# Patient Record
Sex: Female | Born: 1940 | Race: White | Hispanic: No | Smoking: Never smoker
Health system: Southern US, Community
[De-identification: ages and names within clinical notes are randomized; demographics above are authoritative.]

## PROBLEM LIST (undated history)

## (undated) DIAGNOSIS — M810 Age-related osteoporosis without current pathological fracture: Secondary | ICD-10-CM

## (undated) DIAGNOSIS — M1990 Unspecified osteoarthritis, unspecified site: Secondary | ICD-10-CM

## (undated) DIAGNOSIS — E559 Vitamin D deficiency, unspecified: Secondary | ICD-10-CM

## (undated) DIAGNOSIS — E079 Disorder of thyroid, unspecified: Secondary | ICD-10-CM

## (undated) DIAGNOSIS — E039 Hypothyroidism, unspecified: Secondary | ICD-10-CM

## (undated) DIAGNOSIS — E785 Hyperlipidemia, unspecified: Secondary | ICD-10-CM

## (undated) DIAGNOSIS — M5432 Sciatica, left side: Secondary | ICD-10-CM

## (undated) HISTORY — DX: Age-related osteoporosis without current pathological fracture: M81.0

## (undated) HISTORY — DX: Vitamin D deficiency, unspecified: E55.9

## (undated) HISTORY — DX: Disorder of thyroid, unspecified: E07.9

## (undated) HISTORY — DX: Hyperlipidemia, unspecified: E78.5

---

## 2001-07-07 ENCOUNTER — Other Ambulatory Visit: Admission: RE | Admit: 2001-07-07 | Discharge: 2001-07-07 | Payer: Self-pay | Admitting: Family Medicine

## 2004-09-03 ENCOUNTER — Ambulatory Visit: Payer: Self-pay | Admitting: Family Medicine

## 2004-11-23 HISTORY — PX: BREAST BIOPSY: SHX20

## 2005-12-02 ENCOUNTER — Ambulatory Visit: Payer: Self-pay | Admitting: Family Medicine

## 2006-09-16 ENCOUNTER — Ambulatory Visit: Payer: Self-pay | Admitting: Gastroenterology

## 2006-09-16 LAB — HM COLONOSCOPY: HM COLON: NORMAL

## 2006-12-03 ENCOUNTER — Ambulatory Visit: Payer: Self-pay | Admitting: Family Medicine

## 2007-12-08 ENCOUNTER — Ambulatory Visit: Payer: Self-pay | Admitting: Family Medicine

## 2008-12-10 ENCOUNTER — Ambulatory Visit: Payer: Self-pay | Admitting: Family Medicine

## 2009-12-12 ENCOUNTER — Ambulatory Visit: Payer: Self-pay | Admitting: Family Medicine

## 2010-12-18 ENCOUNTER — Ambulatory Visit: Payer: Self-pay | Admitting: Family Medicine

## 2011-06-04 HISTORY — PX: BASAL CELL CARCINOMA EXCISION: SHX1214

## 2011-12-23 ENCOUNTER — Ambulatory Visit: Payer: Self-pay | Admitting: Family Medicine

## 2012-12-01 ENCOUNTER — Ambulatory Visit: Payer: Self-pay | Admitting: Family Medicine

## 2012-12-01 LAB — HM DEXA SCAN

## 2012-12-23 ENCOUNTER — Ambulatory Visit: Payer: Self-pay | Admitting: Family Medicine

## 2014-11-14 LAB — LIPID PANEL
CHOLESTEROL: 194 mg/dL (ref 0–200)
HDL: 85 mg/dL — AB (ref 35–70)
LDL CALC: 79 mg/dL
TRIGLYCERIDES: 152 mg/dL (ref 40–160)

## 2014-11-14 LAB — BASIC METABOLIC PANEL
BUN: 15 mg/dL (ref 4–21)
CREATININE: 0.8 mg/dL (ref 0.5–1.1)
Glucose: 124 mg/dL
Potassium: 4.5 mmol/L (ref 3.4–5.3)
Sodium: 142 mmol/L (ref 137–147)

## 2014-11-14 LAB — HM PAP SMEAR

## 2014-11-14 LAB — CBC AND DIFFERENTIAL
HEMATOCRIT: 40 % (ref 36–46)
Hemoglobin: 13.9 g/dL (ref 12.0–16.0)
PLATELETS: 255 10*3/uL (ref 150–399)
WBC: 8 10*3/mL

## 2014-11-14 LAB — HEMOGLOBIN A1C: Hemoglobin A1C: 6.4

## 2014-11-14 LAB — TSH: TSH: 2.26 u[IU]/mL (ref 0.41–5.90)

## 2014-11-14 LAB — HEPATIC FUNCTION PANEL
ALT: 27 U/L (ref 7–35)
AST: 23 U/L (ref 13–35)

## 2014-12-25 ENCOUNTER — Ambulatory Visit: Payer: Self-pay | Admitting: Family Medicine

## 2014-12-25 DIAGNOSIS — Z1231 Encounter for screening mammogram for malignant neoplasm of breast: Secondary | ICD-10-CM | POA: Diagnosis not present

## 2014-12-25 LAB — HM MAMMOGRAPHY

## 2015-01-09 DIAGNOSIS — M9905 Segmental and somatic dysfunction of pelvic region: Secondary | ICD-10-CM | POA: Diagnosis not present

## 2015-01-09 DIAGNOSIS — M5442 Lumbago with sciatica, left side: Secondary | ICD-10-CM | POA: Diagnosis not present

## 2015-01-09 DIAGNOSIS — M955 Acquired deformity of pelvis: Secondary | ICD-10-CM | POA: Diagnosis not present

## 2015-01-09 DIAGNOSIS — M9902 Segmental and somatic dysfunction of thoracic region: Secondary | ICD-10-CM | POA: Diagnosis not present

## 2015-01-09 DIAGNOSIS — M9903 Segmental and somatic dysfunction of lumbar region: Secondary | ICD-10-CM | POA: Diagnosis not present

## 2015-02-27 DIAGNOSIS — M955 Acquired deformity of pelvis: Secondary | ICD-10-CM | POA: Diagnosis not present

## 2015-02-27 DIAGNOSIS — M5442 Lumbago with sciatica, left side: Secondary | ICD-10-CM | POA: Diagnosis not present

## 2015-02-27 DIAGNOSIS — M9905 Segmental and somatic dysfunction of pelvic region: Secondary | ICD-10-CM | POA: Diagnosis not present

## 2015-02-27 DIAGNOSIS — M9903 Segmental and somatic dysfunction of lumbar region: Secondary | ICD-10-CM | POA: Diagnosis not present

## 2015-02-27 DIAGNOSIS — M9902 Segmental and somatic dysfunction of thoracic region: Secondary | ICD-10-CM | POA: Diagnosis not present

## 2015-03-11 IMAGING — MG MM DIGITAL SCREENING BILAT W/ CAD
1 series · 5 of 5 positions shown · non-contrast
Comparison: Previous exam(s).

CLINICAL DATA: Screening.

EXAM:
DIGITAL SCREENING BILATERAL MAMMOGRAM WITH CAD

[R CC · right · 5 of 5 slices shown]
[im 1/5]
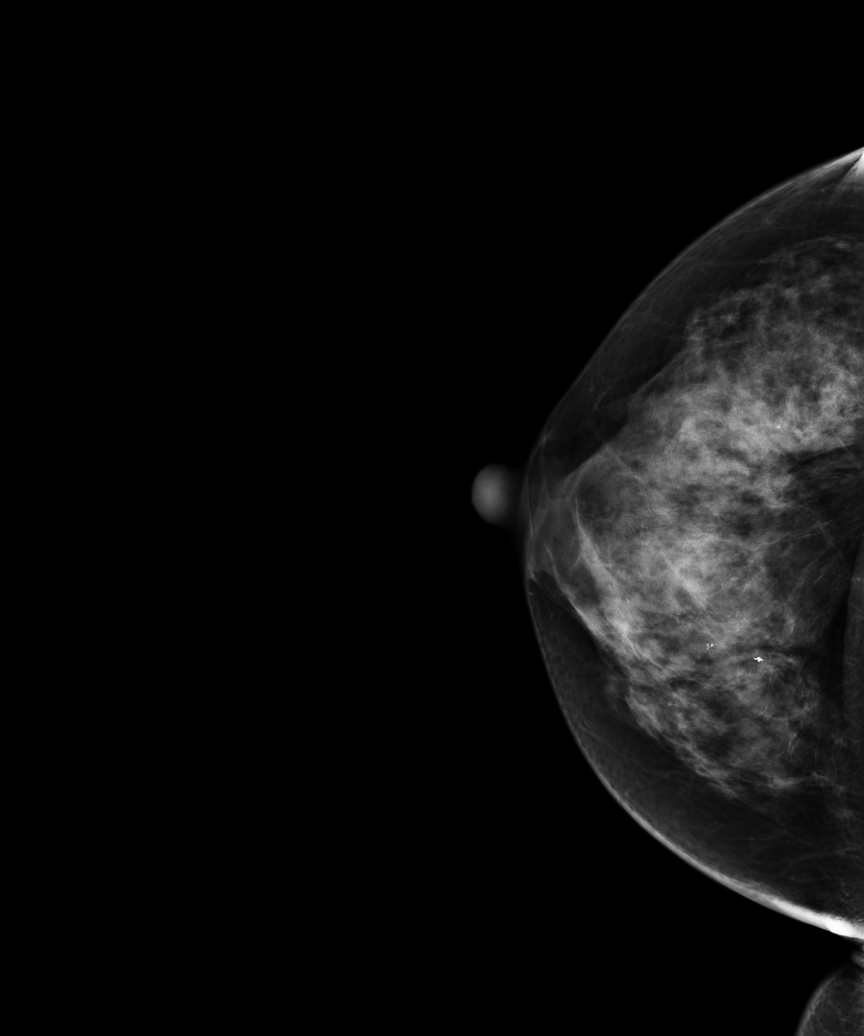
[im 2/5]
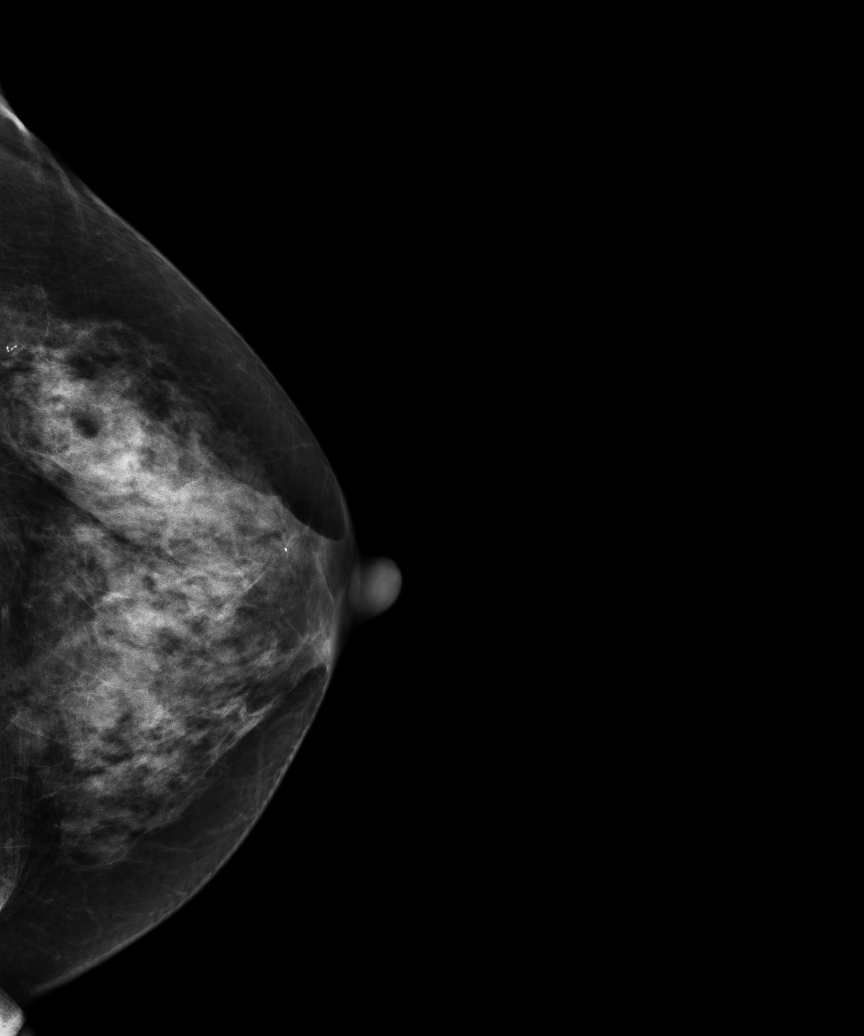
[im 3/5]
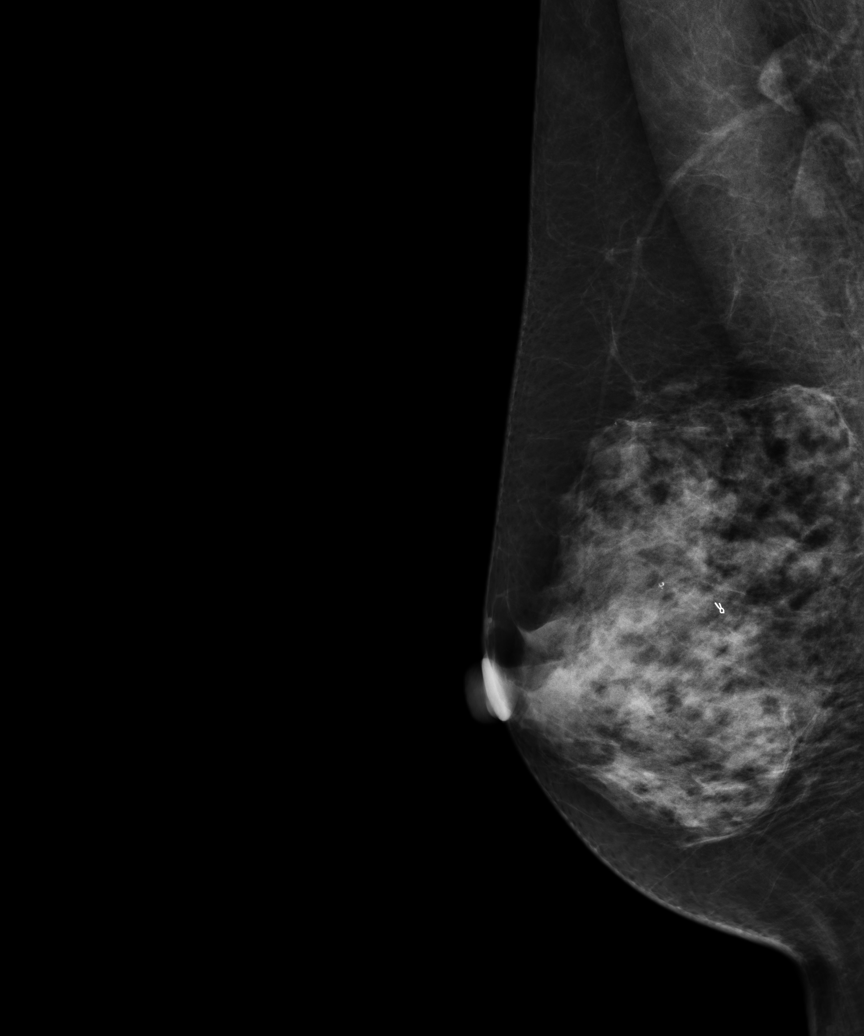
[im 4/5]
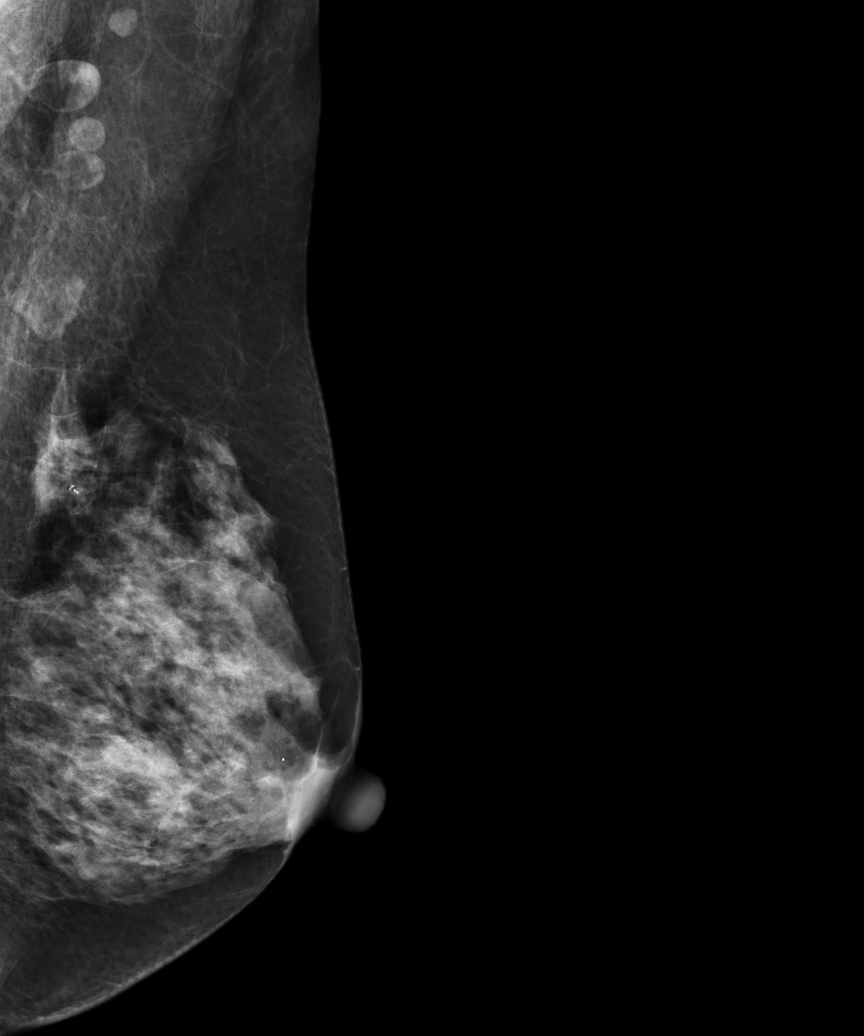
[im 5/5]
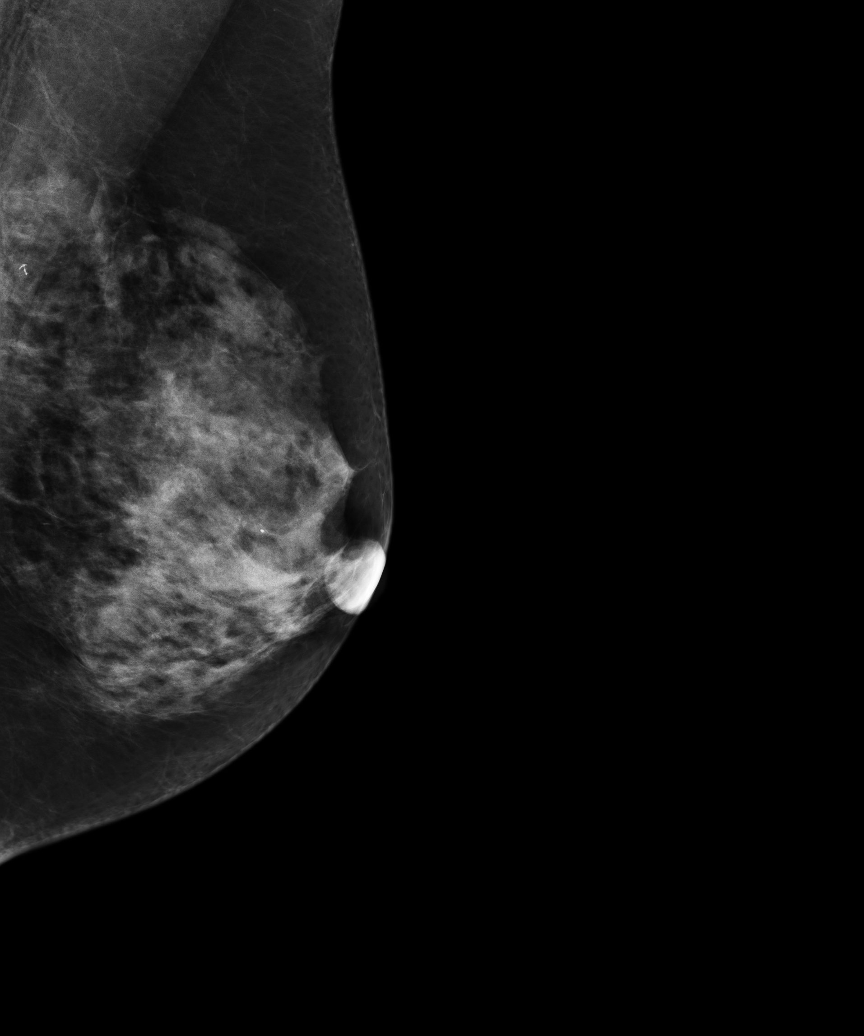

[5 of 5 positions shown; findings below may reference images not displayed]

ACR Breast Density Category d: The breast tissue is extremely dense,
which lowers the sensitivity of mammography.
FINDINGS: There are no findings suspicious for malignancy. Images were
processed with CAD.
IMPRESSION: No mammographic evidence of malignancy. A result letter of this
screening mammogram will be mailed directly to the patient.

RECOMMENDATION:
Screening mammogram in one year. (Code:[id number]-[id number])

BI-RADS CATEGORY  1: Negative.

## 2015-04-03 DIAGNOSIS — M9902 Segmental and somatic dysfunction of thoracic region: Secondary | ICD-10-CM | POA: Diagnosis not present

## 2015-04-03 DIAGNOSIS — M9905 Segmental and somatic dysfunction of pelvic region: Secondary | ICD-10-CM | POA: Diagnosis not present

## 2015-04-03 DIAGNOSIS — M955 Acquired deformity of pelvis: Secondary | ICD-10-CM | POA: Diagnosis not present

## 2015-04-03 DIAGNOSIS — M9903 Segmental and somatic dysfunction of lumbar region: Secondary | ICD-10-CM | POA: Diagnosis not present

## 2015-04-03 DIAGNOSIS — M5442 Lumbago with sciatica, left side: Secondary | ICD-10-CM | POA: Diagnosis not present

## 2015-06-23 ENCOUNTER — Other Ambulatory Visit: Payer: Self-pay | Admitting: Family Medicine

## 2015-06-23 DIAGNOSIS — E785 Hyperlipidemia, unspecified: Secondary | ICD-10-CM

## 2015-06-24 DIAGNOSIS — E785 Hyperlipidemia, unspecified: Secondary | ICD-10-CM | POA: Insufficient documentation

## 2015-06-24 DIAGNOSIS — E782 Mixed hyperlipidemia: Secondary | ICD-10-CM | POA: Insufficient documentation

## 2015-09-04 DIAGNOSIS — M5442 Lumbago with sciatica, left side: Secondary | ICD-10-CM | POA: Diagnosis not present

## 2015-09-04 DIAGNOSIS — M9905 Segmental and somatic dysfunction of pelvic region: Secondary | ICD-10-CM | POA: Diagnosis not present

## 2015-09-04 DIAGNOSIS — M955 Acquired deformity of pelvis: Secondary | ICD-10-CM | POA: Diagnosis not present

## 2015-09-04 DIAGNOSIS — M9902 Segmental and somatic dysfunction of thoracic region: Secondary | ICD-10-CM | POA: Diagnosis not present

## 2015-09-04 DIAGNOSIS — M9903 Segmental and somatic dysfunction of lumbar region: Secondary | ICD-10-CM | POA: Diagnosis not present

## 2015-10-09 DIAGNOSIS — M9903 Segmental and somatic dysfunction of lumbar region: Secondary | ICD-10-CM | POA: Diagnosis not present

## 2015-10-09 DIAGNOSIS — M5442 Lumbago with sciatica, left side: Secondary | ICD-10-CM | POA: Diagnosis not present

## 2015-10-09 DIAGNOSIS — M955 Acquired deformity of pelvis: Secondary | ICD-10-CM | POA: Diagnosis not present

## 2015-10-09 DIAGNOSIS — M9905 Segmental and somatic dysfunction of pelvic region: Secondary | ICD-10-CM | POA: Diagnosis not present

## 2015-10-09 DIAGNOSIS — M9902 Segmental and somatic dysfunction of thoracic region: Secondary | ICD-10-CM | POA: Diagnosis not present

## 2015-10-16 ENCOUNTER — Other Ambulatory Visit: Payer: Self-pay | Admitting: Family Medicine

## 2015-10-16 DIAGNOSIS — E039 Hypothyroidism, unspecified: Secondary | ICD-10-CM | POA: Insufficient documentation

## 2015-10-16 NOTE — Telephone Encounter (Signed)
Last TSH 11/14/2014 and was 2.260. OV scheduled for 11/20/2015. Allene DillonEmily Drozdowski, CMA

## 2015-11-06 DIAGNOSIS — M9903 Segmental and somatic dysfunction of lumbar region: Secondary | ICD-10-CM | POA: Diagnosis not present

## 2015-11-06 DIAGNOSIS — M5442 Lumbago with sciatica, left side: Secondary | ICD-10-CM | POA: Diagnosis not present

## 2015-11-06 DIAGNOSIS — M9905 Segmental and somatic dysfunction of pelvic region: Secondary | ICD-10-CM | POA: Diagnosis not present

## 2015-11-06 DIAGNOSIS — M9902 Segmental and somatic dysfunction of thoracic region: Secondary | ICD-10-CM | POA: Diagnosis not present

## 2015-11-06 DIAGNOSIS — M955 Acquired deformity of pelvis: Secondary | ICD-10-CM | POA: Diagnosis not present

## 2015-11-15 DIAGNOSIS — Z6825 Body mass index (BMI) 25.0-25.9, adult: Secondary | ICD-10-CM | POA: Insufficient documentation

## 2015-11-15 DIAGNOSIS — E118 Type 2 diabetes mellitus with unspecified complications: Secondary | ICD-10-CM | POA: Insufficient documentation

## 2015-11-15 DIAGNOSIS — J309 Allergic rhinitis, unspecified: Secondary | ICD-10-CM | POA: Insufficient documentation

## 2015-11-15 DIAGNOSIS — R319 Hematuria, unspecified: Secondary | ICD-10-CM | POA: Insufficient documentation

## 2015-11-15 DIAGNOSIS — E785 Hyperlipidemia, unspecified: Secondary | ICD-10-CM | POA: Insufficient documentation

## 2015-11-15 DIAGNOSIS — R7303 Prediabetes: Secondary | ICD-10-CM | POA: Insufficient documentation

## 2015-11-20 ENCOUNTER — Ambulatory Visit (INDEPENDENT_AMBULATORY_CARE_PROVIDER_SITE_OTHER): Payer: Medicare Other | Admitting: Family Medicine

## 2015-11-20 ENCOUNTER — Encounter: Payer: Self-pay | Admitting: Family Medicine

## 2015-11-20 VITALS — BP 120/72 | HR 76 | Temp 98.2°F | Resp 16 | Ht 66.0 in | Wt 154.0 lb

## 2015-11-20 DIAGNOSIS — R739 Hyperglycemia, unspecified: Secondary | ICD-10-CM | POA: Diagnosis not present

## 2015-11-20 DIAGNOSIS — E785 Hyperlipidemia, unspecified: Secondary | ICD-10-CM

## 2015-11-20 DIAGNOSIS — Z124 Encounter for screening for malignant neoplasm of cervix: Secondary | ICD-10-CM | POA: Diagnosis not present

## 2015-11-20 DIAGNOSIS — Z0000 Encounter for general adult medical examination without abnormal findings: Secondary | ICD-10-CM

## 2015-11-20 DIAGNOSIS — M8580 Other specified disorders of bone density and structure, unspecified site: Secondary | ICD-10-CM | POA: Insufficient documentation

## 2015-11-20 DIAGNOSIS — E039 Hypothyroidism, unspecified: Secondary | ICD-10-CM

## 2015-11-20 DIAGNOSIS — Z1231 Encounter for screening mammogram for malignant neoplasm of breast: Secondary | ICD-10-CM | POA: Diagnosis not present

## 2015-11-20 DIAGNOSIS — Z1211 Encounter for screening for malignant neoplasm of colon: Secondary | ICD-10-CM | POA: Diagnosis not present

## 2015-11-20 NOTE — Progress Notes (Signed)
Patient: Kelsey Pacheco, Female    DOB: 1941/06/24, 74 y.o.   MRN: [id number] Visit Date: 11/20/2015  Today's Provider: Lorie Phenix, MD   Chief Complaint  Patient presents with  . Medicare Wellness   Subjective:    Annual wellness visit Kelsey Pacheco is a 74 y.o. female. She feels well. She reports exercising daily; walks dog and rides stationary bike. She reports she is sleeping fairly well.  Last CPE- 11/14/2014 Last Pap- 11/14/2014- Sample inconclusive, will repeat in 1 year Last Mammo- 12/25/2014- BI-RADS 1 Last Colon- 09/16/2006- WNL Last BMD- 12/01/2012- Osteopenia- Recheck in 3-5 years. Flu vaccine updated in September at pharmacy -----------------------------------------------------------   Review of Systems  Eyes: Positive for visual disturbance (has seen optometrist for this). Negative for photophobia, pain, discharge, redness and itching.  All other systems reviewed and are negative.   Social History   Social History  . Marital Status: Widowed    Spouse Name: N/A  . Number of Children: 1  . Years of Education: HS   Occupational History  .      RETIRED   Social History Main Topics  . Smoking status: Never Smoker   . Smokeless tobacco: Never Used  . Alcohol Use: No  . Drug Use: No  . Sexual Activity: No   Other Topics Concern  . Not on file   Social History Narrative    No past medical history on file.   Patient Active Problem List   Diagnosis Date Noted  . Allergic rhinitis 11/15/2015  . Body mass index (BMI) of 25.0-25.9 in adult 11/15/2015  . Blood in the urine 11/15/2015  . Blood glucose elevated 11/15/2015  . HLD (hyperlipidemia) 11/15/2015  . Adult hypothyroidism 10/16/2015  . Hyperlipemia 06/24/2015    Past Surgical History  Procedure Laterality Date  . Basal cell carcinoma excision  06/04/11     [facility] (Mohs procedure) left orbital Dr. Cheryll Pacheco  . Ganglion cyst excision Left     lower extremity    Her family history  includes Dementia in her father; Heart attack in her mother; Heart disease in her father.    Previous Medications   ASCORBIC ACID (VITAMIN C) 100 MG TABLET    Take 1 tablet by mouth daily.   ASPIRIN 81 MG TABLET    Take 1 tablet by mouth daily.   ATORVASTATIN (LIPITOR) 40 MG TABLET    TAKE 1 TABLET BY MOUTH EVERY DAY   CALCIUM CARBONATE (CALCIUM 600 PO)    Take 1 tablet by mouth daily.   FLUZONE HIGH-DOSE 0.5 ML SUSY    Reported on 11/20/2015   LEVOTHYROXINE (SYNTHROID, LEVOTHROID) 100 MCG TABLET    TAKE 1 TABLET BY MOUTH EVERY DAY   MULTIPLE VITAMIN PO    Take 1 tablet by mouth daily.   VITAMIN D, CHOLECALCIFEROL, 1000 UNITS TABS    Take 1 tablet by mouth daily.   VITAMIN E 100 UNITS TABS    Take 1 capsule by mouth daily.    Patient Care Team: Kelsey Phenix, MD as PCP - General (Family Medicine)     Objective:   Vitals: BP 120/72 mmHg  Pulse 76  Temp(Src) 98.2 F (36.8 C) (Oral)  Resp 16  Ht  (1.676 m)  Wt 154 lb (69.854 kg)  BMI 24.87 kg/m2  Physical Exam  Constitutional: She is oriented to person, place, and time. She appears well-developed and well-nourished.  HENT:  Head: Normocephalic and atraumatic.  Right Ear:  Tympanic membrane, external ear and ear canal normal.  Left Ear: Tympanic membrane, external ear and ear canal normal.  Nose: Nose normal.  Mouth/Throat: Uvula is midline, oropharynx is clear and moist and mucous membranes are normal.  Eyes: Conjunctivae, EOM and lids are normal. Pupils are equal, round, and reactive to light.  Neck: Trachea normal and normal range of motion. Neck supple. Carotid bruit is not present. No thyroid mass and no thyromegaly present.  Cardiovascular: Normal rate, regular rhythm and normal heart sounds.   Pulmonary/Chest: Effort normal and breath sounds normal.  Abdominal: Soft. Normal appearance and bowel sounds are normal. There is no hepatosplenomegaly. There is no tenderness.  Genitourinary: Uterus normal. No breast swelling,  tenderness or discharge.  Vaginal atrophy present  Musculoskeletal: Normal range of motion.  Lymphadenopathy:    She has no cervical adenopathy.    She has no axillary adenopathy.  Neurological: She is alert and oriented to person, place, and time. She has normal strength. No cranial nerve deficit.  Skin: Skin is warm, dry and intact.  Psychiatric: She has a normal mood and affect. Her speech is normal and behavior is normal. Judgment and thought content normal. Cognition and memory are normal.    Activities of Daily Living In your present state of health, do you have any difficulty performing the following activities: 11/20/2015  Hearing? N  Vision? N  Difficulty concentrating or making decisions? N  Walking or climbing stairs? Y  Dressing or bathing? N  Doing errands, shopping? N    Fall Risk Assessment Fall Risk  11/20/2015  Falls in the past year? No     Depression Screen PHQ 2/9 Scores 11/20/2015  PHQ - 2 Score 0    Cognitive Testing - 6-CIT  Correct? Score   What year is it? yes 0 0 or 4  What month is it? yes 0 0 or 3  Memorize:    Kelsey ParkinsJohn,  Kelsey Pacheco,  42,  [name],  [name],      What time is it? (within 1 hour) yes 0 0 or 3  Count backwards from 20 yes 0 0, 2, or 4  Name the months of the year yes 0 0, 2, or 4  Repeat name & address above yes 0 0, 2, 4, 6, 8, or 10       TOTAL SCORE  0/28   Interpretation:  Normal  Normal (0-7) Abnormal (8-28)       Assessment & Plan:     Annual Wellness Visit  Reviewed patient's Family Medical History Reviewed and updated list of patient's medical providers Assessment of cognitive impairment was done Assessed patient's functional ability Established a written schedule for health screening services Health Risk Assessent Completed and Reviewed   Discussed health benefits of physical activity, and encouraged her to engage in regular exercise appropriate for her age and condition.      ------------------------------------------------------------------------------------------------------------  1. Medicare annual wellness visit, subsequent Stable. As above.  2. HLD (hyperlipidemia) Stable, continue current medications and FU pending lab results. - CBC with Differential/Platelet - Lipid panel  3. Hypothyroidism, unspecified hypothyroidism type Stable, continue current medications and FU pending lab results. - TSH  4. Blood glucose elevated Stable. FU pending lab results. - Comprehensive metabolic panel - Hemoglobin A1c  5. Screening for cervical cancer FU pending pap. - Pap IG (Image Guided)  6. Osteopenia Order BMD as below, FU pending results. - DG Bone Density; Future  7. Encounter for screening mammogram for breast cancer FU  pending results. - MM DIGITAL SCREENING BILATERAL; Future  8. Colon cancer screening Refer back to Dr. Servando Snare for colonoscopy. - Ambulatory referral to Gastroenterology  Patient seen and examined by Leo Grosser, MD, and note scribed by Allene Dillon, CMA.  I have reviewed the document for accuracy and completeness and I agree with above. Leo Grosser, MD   Kelsey Phenix, MD

## 2015-11-21 ENCOUNTER — Telehealth: Payer: Self-pay

## 2015-11-21 LAB — TSH: TSH: 2.14 u[IU]/mL (ref 0.450–4.500)

## 2015-11-21 LAB — CBC WITH DIFFERENTIAL/PLATELET
BASOS ABS: 0 10*3/uL (ref 0.0–0.2)
Basos: 0 %
EOS (ABSOLUTE): 0.1 10*3/uL (ref 0.0–0.4)
Eos: 1 %
HEMOGLOBIN: 13 g/dL (ref 11.1–15.9)
Hematocrit: 38.9 % (ref 34.0–46.6)
IMMATURE GRANS (ABS): 0 10*3/uL (ref 0.0–0.1)
IMMATURE GRANULOCYTES: 0 %
LYMPHS: 20 %
Lymphocytes Absolute: 1.2 10*3/uL (ref 0.7–3.1)
MCH: 30.2 pg (ref 26.6–33.0)
MCHC: 33.4 g/dL (ref 31.5–35.7)
MCV: 91 fL (ref 79–97)
MONOCYTES: 7 %
Monocytes Absolute: 0.4 10*3/uL (ref 0.1–0.9)
NEUTROS ABS: 4.2 10*3/uL (ref 1.4–7.0)
NEUTROS PCT: 72 %
PLATELETS: 229 10*3/uL (ref 150–379)
RBC: 4.3 x10E6/uL (ref 3.77–5.28)
RDW: 13 % (ref 12.3–15.4)
WBC: 5.9 10*3/uL (ref 3.4–10.8)

## 2015-11-21 LAB — COMPREHENSIVE METABOLIC PANEL
ALBUMIN: 4.7 g/dL (ref 3.5–4.8)
ALT: 24 IU/L (ref 0–32)
AST: 22 IU/L (ref 0–40)
Albumin/Globulin Ratio: 2.5 (ref 1.1–2.5)
Alkaline Phosphatase: 93 IU/L (ref 39–117)
BILIRUBIN TOTAL: 0.6 mg/dL (ref 0.0–1.2)
BUN / CREAT RATIO: 22 (ref 11–26)
BUN: 17 mg/dL (ref 8–27)
CALCIUM: 9.5 mg/dL (ref 8.7–10.3)
CHLORIDE: 101 mmol/L (ref 96–106)
CO2: 25 mmol/L (ref 18–29)
CREATININE: 0.77 mg/dL (ref 0.57–1.00)
GFR calc non Af Amer: 76 mL/min/{1.73_m2} (ref >59)
GFR, EST AFRICAN AMERICAN: 88 mL/min/{1.73_m2} (ref >59)
GLUCOSE: 120 mg/dL — AB (ref 65–99)
Globulin, Total: 1.9 g/dL (ref 1.5–4.5)
Potassium: 4.6 mmol/L (ref 3.5–5.2)
Sodium: 142 mmol/L (ref 134–144)
TOTAL PROTEIN: 6.6 g/dL (ref 6.0–8.5)

## 2015-11-21 LAB — HEMOGLOBIN A1C
ESTIMATED AVERAGE GLUCOSE: 137 mg/dL
Hgb A1c MFr Bld: 6.4 % — ABNORMAL HIGH (ref 4.8–5.6)

## 2015-11-21 LAB — LIPID PANEL
CHOLESTEROL TOTAL: 223 mg/dL — AB (ref 100–199)
Chol/HDL Ratio: 2.7 ratio units (ref 0.0–4.4)
HDL: 82 mg/dL (ref >39)
LDL CALC: 112 mg/dL — AB (ref 0–99)
TRIGLYCERIDES: 147 mg/dL (ref 0–149)
VLDL Cholesterol Cal: 29 mg/dL (ref 5–40)

## 2015-11-21 NOTE — Telephone Encounter (Signed)
-----   Message from Lorie PhenixNancy Maloney, MD sent at 11/21/2015  6:38 AM EST ----- Labs stable. BLood sugar the same as previous. Continue cholesterol medication. Thanks.

## 2015-11-21 NOTE — Telephone Encounter (Signed)
Unable to contact patient. Will try again later

## 2015-11-26 LAB — PAP IG (IMAGE GUIDED): PAP Smear Comment: 0

## 2015-11-26 NOTE — Telephone Encounter (Signed)
-----   Message from Lorie PhenixNancy Maloney, MD sent at 11/26/2015  2:32 PM EST ----- Pap is normal. Please notify patient.   Thanks.

## 2015-11-26 NOTE — Telephone Encounter (Signed)
Unable to contact patient. Will try again later

## 2015-11-28 NOTE — Telephone Encounter (Signed)
Advised patient of results.  

## 2015-12-11 DIAGNOSIS — M955 Acquired deformity of pelvis: Secondary | ICD-10-CM | POA: Diagnosis not present

## 2015-12-11 DIAGNOSIS — M5442 Lumbago with sciatica, left side: Secondary | ICD-10-CM | POA: Diagnosis not present

## 2015-12-11 DIAGNOSIS — M9902 Segmental and somatic dysfunction of thoracic region: Secondary | ICD-10-CM | POA: Diagnosis not present

## 2015-12-11 DIAGNOSIS — M9905 Segmental and somatic dysfunction of pelvic region: Secondary | ICD-10-CM | POA: Diagnosis not present

## 2015-12-11 DIAGNOSIS — M9903 Segmental and somatic dysfunction of lumbar region: Secondary | ICD-10-CM | POA: Diagnosis not present

## 2015-12-12 ENCOUNTER — Other Ambulatory Visit: Payer: Self-pay | Admitting: Family Medicine

## 2015-12-12 DIAGNOSIS — E039 Hypothyroidism, unspecified: Secondary | ICD-10-CM

## 2015-12-17 ENCOUNTER — Other Ambulatory Visit: Payer: Self-pay

## 2015-12-17 ENCOUNTER — Telehealth: Payer: Self-pay

## 2015-12-17 NOTE — Telephone Encounter (Signed)
Pt scheduled for screening colonoscopy at [facility] on 01/17/16. Pt has [employer] Medicare. Thanks!

## 2015-12-17 NOTE — Telephone Encounter (Signed)
Gastroenterology Pre-Procedure Review  Request Date: 01/17/16 Requesting Physician: Dr. Elease Hashimoto  PATIENT REVIEW QUESTIONS: The patient responded to the following health history questions as indicated:    1. Are you having any GI issues? no 2. Do you have a personal history of Polyps? no 3. Do you have a family history of Colon Cancer or Polyps? no 4. Diabetes Mellitus? no 5. Joint replacements in the past 12 months?no 6. Major health problems in the past 3 months?no 7. Any artificial heart valves, MVP, or defibrillator?no    MEDICATIONS & ALLERGIES:    Patient reports the following regarding taking any anticoagulation/antiplatelet therapy:   Plavix, Coumadin, Eliquis, Xarelto, Lovenox, Pradaxa, Brilinta, or Effient? no Aspirin? yes (ASA )  Patient confirms/reports the following medications:  Current Outpatient Prescriptions  Medication Sig Dispense Refill  . Ascorbic Acid (VITAMIN C) 100 MG tablet Take 1 tablet by mouth daily.    Marland Kitchen aspirin 81 MG tablet Take 1 tablet by mouth daily.    Marland Kitchen atorvastatin (LIPITOR) 40 MG tablet TAKE 1 TABLET BY MOUTH EVERY DAY 90 tablet 3  . Calcium Carbonate (CALCIUM 600 PO) Take 1 tablet by mouth daily.    Marland Kitchen FLUZONE HIGH-DOSE 0.5 ML SUSY Reported on 11/20/2015  0  . levothyroxine (SYNTHROID, LEVOTHROID) 100 MCG tablet TAKE 1 TABLET BY MOUTH EVERY DAY 90 tablet 1  . MULTIPLE VITAMIN PO Take 1 tablet by mouth daily.    . Vitamin D, Cholecalciferol, 1000 UNITS TABS Take 1 tablet by mouth daily.    . Vitamin E 100 UNITS TABS Take 1 capsule by mouth daily.     No current facility-administered medications for this visit.    Patient confirms/reports the following allergies:  No Known Allergies  No orders of the defined types were placed in this encounter.    AUTHORIZATION INFORMATION Primary Insurance: 1D#: Group #:  Secondary Insurance: 1D#: Group #:  SCHEDULE INFORMATION: Date: 01/17/16 Time: Location: [facility]

## 2015-12-17 NOTE — Telephone Encounter (Signed)
-----   Message from Armandina Gemma sent at 12/11/2015  9:39 AM EST ----- Colon triage

## 2015-12-18 NOTE — Telephone Encounter (Signed)
No authorization is required for CPT: 45378 per Maureen Ralphs with [facility]. Reference number is [id number].

## 2015-12-25 HISTORY — PX: OTHER SURGICAL HISTORY: SHX169

## 2016-01-01 ENCOUNTER — Ambulatory Visit
Admission: RE | Admit: 2016-01-01 | Discharge: 2016-01-01 | Disposition: A | Discharge status: Home / Self Care | Payer: Medicare Other | Source: Ambulatory Visit | Attending: Family Medicine | Admitting: Family Medicine

## 2016-01-01 DIAGNOSIS — Z1231 Encounter for screening mammogram for malignant neoplasm of breast: Secondary | ICD-10-CM | POA: Insufficient documentation

## 2016-01-01 DIAGNOSIS — M8588 Other specified disorders of bone density and structure, other site: Secondary | ICD-10-CM | POA: Diagnosis not present

## 2016-01-01 DIAGNOSIS — M85852 Other specified disorders of bone density and structure, left thigh: Secondary | ICD-10-CM | POA: Insufficient documentation

## 2016-01-01 DIAGNOSIS — M8580 Other specified disorders of bone density and structure, unspecified site: Secondary | ICD-10-CM

## 2016-01-02 ENCOUNTER — Telehealth: Payer: Self-pay

## 2016-01-02 DIAGNOSIS — Z1211 Encounter for screening for malignant neoplasm of colon: Secondary | ICD-10-CM

## 2016-01-02 NOTE — Telephone Encounter (Signed)
Pt advised.  She would like to proceed with Cologuard.  I entered the order.    Thanks,   -Vernona Rieger

## 2016-01-02 NOTE — Telephone Encounter (Signed)
Patient states that she is scheduled to have colonoscopy at the end of march. But wanted to see how you felt about colorguard she wanted to do that instead if you were ok with it and what you thought. Please let pt know. Thank you=-aa

## 2016-01-02 NOTE — Telephone Encounter (Signed)
Not really sure of long term efficacy at this point. May be ok.  Less invasive. May be reasonable if not history of previous polyps or family history.  Thanks.

## 2016-01-07 ENCOUNTER — Telehealth: Payer: Self-pay

## 2016-01-07 NOTE — Telephone Encounter (Signed)
Pt advised; apt made for 01/15/2016 at 8:30  Thanks,   -Vernona Rieger

## 2016-01-07 NOTE — Telephone Encounter (Signed)
-----   Message from Lorie Phenix, MD sent at 01/04/2016  5:08 PM EST ----- Osteoporosis. Recommend ov to address treatment options. Thanks.

## 2016-01-08 DIAGNOSIS — M9905 Segmental and somatic dysfunction of pelvic region: Secondary | ICD-10-CM | POA: Diagnosis not present

## 2016-01-08 DIAGNOSIS — M955 Acquired deformity of pelvis: Secondary | ICD-10-CM | POA: Diagnosis not present

## 2016-01-08 DIAGNOSIS — M9903 Segmental and somatic dysfunction of lumbar region: Secondary | ICD-10-CM | POA: Diagnosis not present

## 2016-01-08 DIAGNOSIS — M5116 Intervertebral disc disorders with radiculopathy, lumbar region: Secondary | ICD-10-CM | POA: Diagnosis not present

## 2016-01-08 DIAGNOSIS — M9902 Segmental and somatic dysfunction of thoracic region: Secondary | ICD-10-CM | POA: Diagnosis not present

## 2016-01-15 ENCOUNTER — Ambulatory Visit (INDEPENDENT_AMBULATORY_CARE_PROVIDER_SITE_OTHER): Payer: Medicare Other | Admitting: Family Medicine

## 2016-01-15 ENCOUNTER — Encounter: Payer: Self-pay | Admitting: Family Medicine

## 2016-01-15 VITALS — BP 120/68 | HR 68 | Temp 98.5°F | Resp 16 | Wt 156.0 lb

## 2016-01-15 DIAGNOSIS — M810 Age-related osteoporosis without current pathological fracture: Secondary | ICD-10-CM

## 2016-01-15 MED ORDER — ALENDRONATE SODIUM 70 MG PO TABS: 70.0000 mg | ORAL_TABLET | ORAL | Status: DC

## 2016-01-15 NOTE — Progress Notes (Signed)
Patient ID: Kelsey Pacheco, female   DOB: 09/03/1941, 75 y.o.   MRN: [id number]         Patient: Kelsey Pacheco Female    DOB: March 03, 1941   75 y.o.   MRN: [id number] Visit Date: 01/15/2016  Today's Provider: Lorie Phenix, MD   Chief Complaint  Patient presents with  . Osteoporosis   Subjective:    HPI   Osteoporosis: Patient complains of osteoporosis. She was diagnosed with osteoporosis by bone density scan in 01/01/2016. Patient denies history of fracture.The cause of osteoporosis is felt to be due to postmenopausal estrogen deficiency.   She is currently being treated with calcium and vitamin D supplementation.  She is not currently being treated with bisphosphonates  Osteoporosis Risk Factors  Nonmodifiable Personal Hx of fracture as an adult: no Hx of fracture in first-degree relative: yes - Mother Caucasian race: yes Advanced age: not applicable Female sex: yes Dementia: no Poor health/frailty: no  Potentially modifiable: Tobacco use: no Low body weight (<127 lbs): no Estrogen deficiency  early menopause (age <45) or bilateral ovariectomy: no  prolonged premenopausal amenorrhea (>1 yr): no Low calcium intake (lifelong): no Alcoholism: no Recurrent falls: no Inadequate physical activity: yes        No Known Allergies Previous Medications   ASCORBIC ACID (VITAMIN C) 100 MG TABLET    Take 1 tablet by mouth daily.   ASPIRIN 81 MG TABLET    Take 1 tablet by mouth daily.   ATORVASTATIN (LIPITOR) 40 MG TABLET    TAKE 1 TABLET BY MOUTH EVERY DAY   CALCIUM CARBONATE (CALCIUM 600 PO)    Take 1 tablet by mouth daily.   LEVOTHYROXINE (SYNTHROID, LEVOTHROID) 100 MCG TABLET    TAKE 1 TABLET BY MOUTH EVERY DAY   MULTIPLE VITAMIN PO    Take 1 tablet by mouth daily.   VITAMIN D, CHOLECALCIFEROL, 1000 UNITS TABS    Take 1 tablet by mouth daily.   VITAMIN E 100 UNITS TABS    Take 1 capsule by mouth daily.    Review of Systems  Constitutional: Negative.     Genitourinary: Decreased urine volume: Having sciatica. Seeing chiropractor.    Musculoskeletal: Positive for arthralgias.    Social History  Substance Use Topics  . Smoking status: Never Smoker   . Smokeless tobacco: Never Used  . Alcohol Use: No   Objective:   BP 120/68 mmHg  Pulse 68  Temp(Src) 98.5 F (36.9 C) (Oral)  Resp 16  Wt 156 lb (70.761 kg)  Physical Exam  Constitutional: She is oriented to person, place, and time. She appears well-developed and well-nourished.  Neurological: She is alert and oriented to person, place, and time.  Psychiatric: She has a normal mood and affect. Her behavior is normal. Judgment and thought content normal.      Assessment & Plan:     1. Osteoporosis New problem. Discussed risks and benefits of medication. 10 year risk of hip fracture is 13 percent. Normal kidney function, no GI or dental issues. Will try medication.  Discussed importance of taking as directed.   - alendronate (FOSAMAX) 70 MG tablet; Take 1 tablet (70 mg total) by mouth every 7 (seven) days. Take with a full glass of water on an empty stomach.  Dispense: 4 tablet; Refill: 11    Patient was seen and examined by Leo Grosser, MD, and note scribed by Kavin Leech, CMA.  I have reviewed the document for accuracy and completeness and I agree  with above. - Leo Grosser, MD   Lorie Phenix, MD  [facility]

## 2016-01-20 DIAGNOSIS — Z1212 Encounter for screening for malignant neoplasm of rectum: Secondary | ICD-10-CM | POA: Diagnosis not present

## 2016-01-20 DIAGNOSIS — Z1211 Encounter for screening for malignant neoplasm of colon: Secondary | ICD-10-CM | POA: Diagnosis not present

## 2016-02-04 LAB — COLOGUARD: COLOGUARD: NEGATIVE

## 2016-02-05 DIAGNOSIS — M9902 Segmental and somatic dysfunction of thoracic region: Secondary | ICD-10-CM | POA: Diagnosis not present

## 2016-02-05 DIAGNOSIS — M9905 Segmental and somatic dysfunction of pelvic region: Secondary | ICD-10-CM | POA: Diagnosis not present

## 2016-02-05 DIAGNOSIS — M9903 Segmental and somatic dysfunction of lumbar region: Secondary | ICD-10-CM | POA: Diagnosis not present

## 2016-02-05 DIAGNOSIS — M955 Acquired deformity of pelvis: Secondary | ICD-10-CM | POA: Diagnosis not present

## 2016-02-05 DIAGNOSIS — M5116 Intervertebral disc disorders with radiculopathy, lumbar region: Secondary | ICD-10-CM | POA: Diagnosis not present

## 2016-02-10 ENCOUNTER — Ambulatory Visit: Admit: 2016-02-10 | Payer: Self-pay | Admitting: Gastroenterology

## 2016-02-10 SURGERY — COLONOSCOPY WITH PROPOFOL
Anesthesia: Choice

## 2016-03-31 DIAGNOSIS — M5116 Intervertebral disc disorders with radiculopathy, lumbar region: Secondary | ICD-10-CM | POA: Diagnosis not present

## 2016-03-31 DIAGNOSIS — M9905 Segmental and somatic dysfunction of pelvic region: Secondary | ICD-10-CM | POA: Diagnosis not present

## 2016-03-31 DIAGNOSIS — M9903 Segmental and somatic dysfunction of lumbar region: Secondary | ICD-10-CM | POA: Diagnosis not present

## 2016-03-31 DIAGNOSIS — M955 Acquired deformity of pelvis: Secondary | ICD-10-CM | POA: Diagnosis not present

## 2016-03-31 DIAGNOSIS — M9902 Segmental and somatic dysfunction of thoracic region: Secondary | ICD-10-CM | POA: Diagnosis not present

## 2016-04-01 ENCOUNTER — Encounter: Payer: Self-pay | Admitting: Internal Medicine

## 2016-04-01 ENCOUNTER — Ambulatory Visit (INDEPENDENT_AMBULATORY_CARE_PROVIDER_SITE_OTHER): Payer: Medicare Other | Admitting: Internal Medicine

## 2016-04-01 VITALS — BP 122/80 | HR 80 | Ht 66.0 in | Wt 153.0 lb

## 2016-04-01 DIAGNOSIS — R7303 Prediabetes: Secondary | ICD-10-CM

## 2016-04-01 DIAGNOSIS — M5442 Lumbago with sciatica, left side: Secondary | ICD-10-CM | POA: Insufficient documentation

## 2016-04-01 DIAGNOSIS — E785 Hyperlipidemia, unspecified: Secondary | ICD-10-CM | POA: Diagnosis not present

## 2016-04-01 DIAGNOSIS — E039 Hypothyroidism, unspecified: Secondary | ICD-10-CM

## 2016-04-01 DIAGNOSIS — M8580 Other specified disorders of bone density and structure, unspecified site: Secondary | ICD-10-CM

## 2016-04-01 NOTE — Progress Notes (Signed)
Date:  04/01/2016   Name:  Kelsey Pacheco   DOB:  01/27/1941   MRN:  [id number]  Transferring from Dr. Elease Hashimoto who is leaving.  No problems, just wants to establish care.  Chief Complaint: Establish Care; Hypothyroidism; and Hyperlipidemia Hyperlipidemia This is a chronic problem. Episode onset: many years. She has no history of chronic renal disease, diabetes, hypothyroidism or nephrotic syndrome. Pertinent negatives include no chest pain or shortness of breath. Current antihyperlipidemic treatment includes statins. The current treatment provides significant improvement of lipids. There are no compliance problems.  There are no known risk factors for coronary artery disease.  Thyroid Problem Presents for follow-up visit. Pacheco reports no fatigue, palpitations or tremors. The symptoms have been stable. Her past medical history is significant for hyperlipidemia. There is no history of diabetes.   Pre-diabetes - last A1C 6.5. She does not check her blood sugar.  She follows a good diet and walks daily.  She has a meter at home which she could use.    Lab Results  Component Value Date   HGBA1C 6.4* 11/20/2015   Osteopenia - DEXA earlier this year showed osteopenia at the hip.  She elected to start on Alendronate along with calcium and vitamin D.  She takes the medication as directed and has had no side effects.  There is no hx of fracture and no family hx osteoporosis.  Review of Systems  Constitutional: Negative for fever, chills and fatigue.  HENT: Negative for hearing loss.   Eyes: Negative for visual disturbance.  Respiratory: Negative for choking, chest tightness, shortness of breath and wheezing.   Cardiovascular: Negative for chest pain, palpitations and leg swelling.  Gastrointestinal: Negative for abdominal pain and blood in stool.  Genitourinary: Negative for hematuria.  Musculoskeletal: Positive for arthralgias (intermittent sciatica).  Skin: Negative for color change and  rash.  Neurological: Negative for dizziness, tremors and numbness.  Psychiatric/Behavioral: Negative for sleep disturbance and dysphoric mood.    Pacheco Active Problem List   Diagnosis Date Noted  . Osteopenia 11/20/2015  . Allergic rhinitis 11/15/2015  . Body mass index (BMI) of 25.0-25.9 in adult 11/15/2015  . Blood in the urine 11/15/2015  . Prediabetes 11/15/2015  . Adult hypothyroidism 10/16/2015  . Hyperlipemia 06/24/2015    Prior to Admission medications   Medication Sig Start Date End Date Taking? Authorizing Provider  alendronate (FOSAMAX) 70 MG tablet Take 1 tablet (70 mg total) by mouth every 7 (seven) days. Take with a full glass of water on an empty stomach. 01/15/16  Yes Lorie Phenix, MD  Ascorbic Acid (VITAMIN C) 100 MG tablet Take 1 tablet by mouth daily.   Yes Historical Provider, MD  aspirin 81 MG tablet Take 1 tablet by mouth daily.   Yes Historical Provider, MD  atorvastatin (LIPITOR) 40 MG tablet TAKE 1 TABLET BY MOUTH EVERY DAY 06/24/15  Yes Lorie Phenix, MD  Calcium Carbonate (CALCIUM 600 PO) Take 1 tablet by mouth daily.   Yes Historical Provider, MD  levothyroxine (SYNTHROID, LEVOTHROID) 100 MCG tablet TAKE 1 TABLET BY MOUTH EVERY DAY 12/12/15  Yes Lorie Phenix, MD  MULTIPLE VITAMIN PO Take 1 tablet by mouth daily.   Yes Historical Provider, MD  Vitamin D, Cholecalciferol, 1000 UNITS TABS Take 1 tablet by mouth daily.   Yes Historical Provider, MD  Vitamin E 100 UNITS TABS Take 1 capsule by mouth daily.   Yes Historical Provider, MD    No Known Allergies  Past Surgical History  Procedure  Laterality Date  . Basal cell carcinoma excision  06/04/11     [facility] (Mohs procedure) left orbital Dr. Cheryll DessertMerrit  . Breast biopsy Right 2006    bx/clip-neg  . Cologuard home test  12/2015    Negative    Social History  Substance Use Topics  . Smoking status: Never Smoker   . Smokeless tobacco: Never Used  . Alcohol Use: No    Medication list has been reviewed and  updated.  Physical Exam  Constitutional: She is oriented to person, place, and time. She appears well-developed and well-nourished. No distress.  HENT:  Head: Normocephalic and atraumatic.  Neck: Normal range of motion. Neck supple. Carotid bruit is not present. No thyromegaly present.  Cardiovascular: Normal rate, regular rhythm, normal heart sounds and normal pulses.   Pulmonary/Chest: Effort normal and breath sounds normal. No respiratory distress. She has no wheezes. She has no rales.  Musculoskeletal: Normal range of motion. She exhibits no edema or tenderness.  Neurological: She is alert and oriented to person, place, and time.  Skin: Skin is warm and dry. No rash noted.  Psychiatric: She has a normal mood and affect. Her speech is normal and behavior is normal. Thought content normal.  Nursing note and vitals reviewed.   BP 122/80 mmHg  Pulse 80  Ht 5\' 6"  (1.676 m)  Wt 153 lb (69.4 kg)  BMI 24.71 kg/m2  Assessment and Plan: 1. Prediabetes Recommend A1C twice yearly - also suggest she begin testing blood sugars intermittently 2-3 times per month - Hemoglobin A1c  2. Hypothyroidism, unspecified hypothyroidism type supplemented  3. Osteopenia On Fosamax, Calcium and Vit D without side effects  EXAM: DUAL X-RAY ABSORPTIOMETRY (DXA) FOR BONE MINERAL DENSITY  IMPRESSION: Dear Dr Lorie PhenixNancy Maloney, Your Pacheco Kelsey Pacheco completed a BMD test on 01/01/2016 using the [facility] Prodigy Advance DXA System (analysis version: 14.10) manufactured by [employer]. The following summarizes the results of our evaluation. Pacheco BIOGRAPHICAL: Name: Kelsey Pacheco, Kelsey Pacheco ID: [id number] Birth Date: 1941-10-04 Height: 65.0 in. Gender: Female Exam Date: 01/01/2016 Weight: 153.4 lbs. Indications: Advanced Age, Caucasian, Family History of Fracture, Family Hx of Osteoporosis, Height Loss, POSTmenopausal, Family Hist. (Parent hip fracture) Fractures: Treatments: 81 MG ASPIRIN,  Calcium, levothyroxin, multivitamin, Vitamin D  ASSESSMENT: The BMD measured at Femur Neck Left is 0.770 g/cm2 with a T-score of -1.9. This Pacheco is considered osteopenic according to World Health Organization [facility](WHO) criteria.L2 was excluded due to degenerative changes.  Site Region Measured Measured WHO Young Adult BMD Date Age Classification T-score DualFemur Neck Left 01/01/2016 74.8 Osteopenia -1.9 0.770 g/cm2 DualFemur Neck Left 12/01/2012 71.7 Osteopenia -1.3 0.855 g/cm2  AP Spine L1-L4 (L2) 01/01/2016 74.8 Normal 1.2 1.318 g/cm2 AP Spine L1-L4 (L2) 12/01/2012 71.7 Normal 1.0 1.308 g/cm2  World Health Organization [facility](WHO) criteria for post-menopausal, Caucasian Women: Normal: T-score at or above -1 SD Osteopenia: T-score between -1 and -2.5 SD Osteoporosis: T-score at or below -2.5 SD RECOMMENDATIONS: National Osteoporosis Foundation recommends that FDA-approved medical therapies be considered in postemenopausal women and men age 150 or older with a:  1. Hip or vertebral (clinical or morphometric) fracture. 2. T-score of < -2.5at the spine or hip. 3. Ten-year fracture probability by FRAX of 3% or greater for hip fracture or 20% or greater for major osteoporotic fracture.  All treatment decisions require clinical judgment and consideration of individual Pacheco factors, including Pacheco preferences, co-morbidities, previous drug use, risk factors not captured in the FRAX model (e.g. falls, vitamin D deficiency, increased bone  turnover, interval significant decline in bone density) and possible under - or over-estimation of fracture risk by FRAX.  All patients should ensure an adequate intake of dietary calcium (1200 mg/d) and vitamin D (800 IU daily) unless contraindicated. FOLLOW-UP: People with diagnosed cases of osteoporosis or at high risk for fracture should have regular bone mineral density tests. For patients eligible for Medicare,  routine testing is allowed once every 2 years. The testing frequency can be increased to one year for patients who have rapidly progressing disease, those who are receiving or discontinuing medical therapy to restore bone mass, or have additional risk factors.  I have reviewed this report, and agree with the above findings.  [facility] Dear Dr Lorie Phenix,  Your Pacheco GERALD HONEA completed a FRAX assessment on 01/01/2016 using the [facility] Prodigy Advance DXA System (analysis version: 14.10) manufactured by [employer]. The following summarizes the results of our evaluation.  Pacheco BIOGRAPHICAL: Name: Aniayah, Alaniz Pacheco ID: [id number] Birth Date: 19-Nov-1941 Height: 65.0 in. Gender: Female [age] Weight: 153.4 lbs. Ethnicity: White Exam Date: 01/01/2016  FRAX* RESULTS: (version: 3.5) 10-year Probability of Fracture1 Major Osteoporotic Fracture2 Hip Fracture 22.9% 13.0% Population: [place] (Caucasian) Risk Factors: Family Hist. (Parent hip fracture)  Based on Femur (Left) Neck BMD  1 -The 10-year probability of fracture may be lower than reported if the Pacheco has received treatment. 2 -Major Osteoporotic Fracture: Clinical Spine, Forearm, Hip or Shoulder  *FRAX is a Armed forces logistics/support/administrative officer of the [employer] for Metabolic Bone Disease, a World Science writer (WHO) [employer].  ASSESSMENT: The probability of a major osteoporotic fracture is 22.9 %within the next ten years.  The probability of a hip fracture is 13.0% within the next ten years.   Electronically Signed  By: David Swaziland M.D.  On: 01/01/2016 13:48        4. Hyperlipidemia On statin therapy    Bari Edward, MD [facility]  04/01/2016

## 2016-04-02 LAB — HEMOGLOBIN A1C
Est. average glucose Bld gHb Est-mCnc: 143 mg/dL
Hgb A1c MFr Bld: 6.6 % — ABNORMAL HIGH (ref 4.8–5.6)

## 2016-04-28 DIAGNOSIS — M5116 Intervertebral disc disorders with radiculopathy, lumbar region: Secondary | ICD-10-CM | POA: Diagnosis not present

## 2016-04-28 DIAGNOSIS — M9903 Segmental and somatic dysfunction of lumbar region: Secondary | ICD-10-CM | POA: Diagnosis not present

## 2016-04-28 DIAGNOSIS — M9902 Segmental and somatic dysfunction of thoracic region: Secondary | ICD-10-CM | POA: Diagnosis not present

## 2016-04-28 DIAGNOSIS — M9905 Segmental and somatic dysfunction of pelvic region: Secondary | ICD-10-CM | POA: Diagnosis not present

## 2016-04-28 DIAGNOSIS — M955 Acquired deformity of pelvis: Secondary | ICD-10-CM | POA: Diagnosis not present

## 2016-05-07 ENCOUNTER — Other Ambulatory Visit: Payer: Self-pay | Admitting: Internal Medicine

## 2016-06-02 DIAGNOSIS — M5116 Intervertebral disc disorders with radiculopathy, lumbar region: Secondary | ICD-10-CM | POA: Diagnosis not present

## 2016-06-02 DIAGNOSIS — M955 Acquired deformity of pelvis: Secondary | ICD-10-CM | POA: Diagnosis not present

## 2016-06-02 DIAGNOSIS — M9902 Segmental and somatic dysfunction of thoracic region: Secondary | ICD-10-CM | POA: Diagnosis not present

## 2016-06-02 DIAGNOSIS — M9905 Segmental and somatic dysfunction of pelvic region: Secondary | ICD-10-CM | POA: Diagnosis not present

## 2016-06-02 DIAGNOSIS — M9903 Segmental and somatic dysfunction of lumbar region: Secondary | ICD-10-CM | POA: Diagnosis not present

## 2016-07-07 DIAGNOSIS — M955 Acquired deformity of pelvis: Secondary | ICD-10-CM | POA: Diagnosis not present

## 2016-07-07 DIAGNOSIS — M5116 Intervertebral disc disorders with radiculopathy, lumbar region: Secondary | ICD-10-CM | POA: Diagnosis not present

## 2016-07-07 DIAGNOSIS — M9902 Segmental and somatic dysfunction of thoracic region: Secondary | ICD-10-CM | POA: Diagnosis not present

## 2016-07-07 DIAGNOSIS — M9903 Segmental and somatic dysfunction of lumbar region: Secondary | ICD-10-CM | POA: Diagnosis not present

## 2016-07-07 DIAGNOSIS — M9905 Segmental and somatic dysfunction of pelvic region: Secondary | ICD-10-CM | POA: Diagnosis not present

## 2016-08-04 DIAGNOSIS — M5116 Intervertebral disc disorders with radiculopathy, lumbar region: Secondary | ICD-10-CM | POA: Diagnosis not present

## 2016-08-04 DIAGNOSIS — M955 Acquired deformity of pelvis: Secondary | ICD-10-CM | POA: Diagnosis not present

## 2016-08-04 DIAGNOSIS — M9903 Segmental and somatic dysfunction of lumbar region: Secondary | ICD-10-CM | POA: Diagnosis not present

## 2016-08-04 DIAGNOSIS — M9905 Segmental and somatic dysfunction of pelvic region: Secondary | ICD-10-CM | POA: Diagnosis not present

## 2016-08-04 DIAGNOSIS — M9902 Segmental and somatic dysfunction of thoracic region: Secondary | ICD-10-CM | POA: Diagnosis not present

## 2016-08-19 ENCOUNTER — Encounter (INDEPENDENT_AMBULATORY_CARE_PROVIDER_SITE_OTHER): Payer: Self-pay

## 2016-08-19 ENCOUNTER — Encounter: Payer: Self-pay | Admitting: Internal Medicine

## 2016-08-19 ENCOUNTER — Ambulatory Visit (INDEPENDENT_AMBULATORY_CARE_PROVIDER_SITE_OTHER): Payer: Medicare Other | Admitting: Internal Medicine

## 2016-08-19 VITALS — BP 142/80 | HR 68 | Temp 100.1°F | Resp 16 | Ht 66.0 in | Wt 155.0 lb

## 2016-08-19 DIAGNOSIS — B029 Zoster without complications: Secondary | ICD-10-CM | POA: Diagnosis not present

## 2016-08-19 MED ORDER — VALACYCLOVIR HCL 1 G PO TABS: 1000.0000 mg | ORAL_TABLET | Freq: Three times a day (TID) | ORAL | 0 refills | Status: DC

## 2016-08-19 NOTE — Progress Notes (Signed)
Date:  08/19/2016   Name:  Parks RangerMargaret L Pacheco   DOB:  02/28/1941   MRN:  [id number]   Chief Complaint: Herpes Zoster (Right side of back. ) Patient noted onset of discomfort in her right lower back and axillary region last evening after her shower. During the night was very uncomfortable for her to sleep on that side. This morning she noticed red blisters and came for evaluation. She did receive Zostavax in 2012. She took Aleve this morning for the pain with some benefit.   Review of Systems  Constitutional: Negative for chills, fatigue and fever.  Respiratory: Negative for chest tightness and shortness of breath.   Cardiovascular: Negative for chest pain.  Skin: Positive for color change and rash.    Patient Active Problem List   Diagnosis Date Noted  . Left-sided low back pain with left-sided sciatica 04/01/2016  . Osteopenia 11/20/2015  . Allergic rhinitis 11/15/2015  . Body mass index (BMI) of 25.0-25.9 in adult 11/15/2015  . Blood in the urine 11/15/2015  . Prediabetes 11/15/2015  . Adult hypothyroidism 10/16/2015  . Hyperlipidemia 06/24/2015    Prior to Admission medications   Medication Sig Start Date End Date Taking? Authorizing Provider  alendronate (FOSAMAX) 70 MG tablet Take 1 tablet (70 mg total) by mouth every 7 (seven) days. Take with a full glass of water on an empty stomach. 01/15/16  Yes Lorie PhenixNancy Maloney, MD  Ascorbic Acid (VITAMIN C) 100 MG tablet Take 1 tablet by mouth daily.   Yes Historical Provider, MD  aspirin 81 MG tablet Take 1 tablet by mouth daily.   Yes Historical Provider, MD  atorvastatin (LIPITOR) 40 MG tablet TAKE 1 TABLET BY MOUTH EVERY DAY 05/07/16  Yes Reubin MilanLaura H Stormy Connon, MD  Calcium Carbonate (CALCIUM 600 PO) Take 1 tablet by mouth daily.   Yes Historical Provider, MD  levothyroxine (SYNTHROID, LEVOTHROID) 100 MCG tablet TAKE 1 TABLET BY MOUTH EVERY DAY 05/07/16  Yes Reubin MilanLaura H Klye Besecker, MD  MULTIPLE VITAMIN PO Take 1 tablet by mouth daily.   Yes  Historical Provider, MD  Vitamin D, Cholecalciferol, 1000 UNITS TABS Take 1 tablet by mouth daily.   Yes Historical Provider, MD  Vitamin E 100 UNITS TABS Take 1 capsule by mouth daily.   Yes Historical Provider, MD    No Known Allergies  Past Surgical History:  Procedure Laterality Date  . BASAL CELL CARCINOMA EXCISION  06/04/11    [facility] (Mohs procedure) left orbital Dr. Cheryll DessertMerrit  . BREAST BIOPSY Right 2006   bx/clip-neg  . Cologuard home test  12/2015   Negative    Social History  Substance Use Topics  . Smoking status: Never Smoker  . Smokeless tobacco: Never Used  . Alcohol use No     Medication list has been reviewed and updated.   Physical Exam  Constitutional: She is oriented to person, place, and time. She appears well-developed. No distress.  HENT:  Head: Normocephalic and atraumatic.  Cardiovascular: Normal rate, regular rhythm and normal heart sounds.   Pulmonary/Chest: Effort normal and breath sounds normal. No respiratory distress.  Musculoskeletal: Normal range of motion.  Neurological: She is alert and oriented to person, place, and time.  Skin: Skin is warm and dry. Rash noted. There is erythema.     Psychiatric: She has a normal mood and affect. Her behavior is normal. Thought content normal.  Nursing note and vitals reviewed.   BP (!) 142/80   Pulse 68   Temp 100.1 F (37.8  C) (Oral)   Resp 16   Ht 5\' 6"  (1.676 m)   Wt 155 lb (70.3 kg)   SpO2 98%   BMI 25.02 kg/m   Assessment and Plan: 1. Zoster Continue Aleve bid for pain - call if stronger medication is needed - valACYclovir (VALTREX) 1000 MG tablet; Take 1 tablet (1,000 mg total) by mouth 3 (three) times daily.  Dispense: 30 tablet; Refill: 0   Bari Edward, MD [facility]  08/19/2016

## 2016-08-19 NOTE — Patient Instructions (Signed)

## 2016-09-17 DIAGNOSIS — M955 Acquired deformity of pelvis: Secondary | ICD-10-CM | POA: Diagnosis not present

## 2016-09-17 DIAGNOSIS — M9903 Segmental and somatic dysfunction of lumbar region: Secondary | ICD-10-CM | POA: Diagnosis not present

## 2016-09-17 DIAGNOSIS — M9905 Segmental and somatic dysfunction of pelvic region: Secondary | ICD-10-CM | POA: Diagnosis not present

## 2016-09-17 DIAGNOSIS — M9902 Segmental and somatic dysfunction of thoracic region: Secondary | ICD-10-CM | POA: Diagnosis not present

## 2016-09-17 DIAGNOSIS — M5116 Intervertebral disc disorders with radiculopathy, lumbar region: Secondary | ICD-10-CM | POA: Diagnosis not present

## 2016-09-21 ENCOUNTER — Ambulatory Visit (INDEPENDENT_AMBULATORY_CARE_PROVIDER_SITE_OTHER): Payer: Medicare Other

## 2016-09-21 DIAGNOSIS — Z23 Encounter for immunization: Secondary | ICD-10-CM | POA: Diagnosis not present

## 2016-10-15 ENCOUNTER — Other Ambulatory Visit: Payer: Self-pay | Admitting: Family Medicine

## 2016-10-15 DIAGNOSIS — M810 Age-related osteoporosis without current pathological fracture: Secondary | ICD-10-CM

## 2016-10-21 DIAGNOSIS — M5116 Intervertebral disc disorders with radiculopathy, lumbar region: Secondary | ICD-10-CM | POA: Diagnosis not present

## 2016-10-21 DIAGNOSIS — M9905 Segmental and somatic dysfunction of pelvic region: Secondary | ICD-10-CM | POA: Diagnosis not present

## 2016-10-21 DIAGNOSIS — M9902 Segmental and somatic dysfunction of thoracic region: Secondary | ICD-10-CM | POA: Diagnosis not present

## 2016-10-21 DIAGNOSIS — M9903 Segmental and somatic dysfunction of lumbar region: Secondary | ICD-10-CM | POA: Diagnosis not present

## 2016-10-21 DIAGNOSIS — M955 Acquired deformity of pelvis: Secondary | ICD-10-CM | POA: Diagnosis not present

## 2016-10-23 ENCOUNTER — Encounter: Payer: Self-pay | Admitting: Internal Medicine

## 2016-10-23 ENCOUNTER — Ambulatory Visit (INDEPENDENT_AMBULATORY_CARE_PROVIDER_SITE_OTHER): Payer: Medicare Other | Admitting: Internal Medicine

## 2016-10-23 VITALS — BP 120/82 | HR 72 | Ht 66.0 in | Wt 154.0 lb

## 2016-10-23 DIAGNOSIS — R7303 Prediabetes: Secondary | ICD-10-CM

## 2016-10-23 DIAGNOSIS — E782 Mixed hyperlipidemia: Secondary | ICD-10-CM | POA: Diagnosis not present

## 2016-10-23 DIAGNOSIS — M8580 Other specified disorders of bone density and structure, unspecified site: Secondary | ICD-10-CM

## 2016-10-23 DIAGNOSIS — E039 Hypothyroidism, unspecified: Secondary | ICD-10-CM | POA: Diagnosis not present

## 2016-10-23 DIAGNOSIS — Z0000 Encounter for general adult medical examination without abnormal findings: Secondary | ICD-10-CM

## 2016-10-23 DIAGNOSIS — Z1231 Encounter for screening mammogram for malignant neoplasm of breast: Secondary | ICD-10-CM

## 2016-10-23 LAB — POC URINALYSIS WITH MICROSCOPIC (NON AUTO)MANUAL RESULT
Bilirubin, UA: NEGATIVE
Blood, UA: NEGATIVE
CRYSTALS: 0
EPITHELIAL CELLS, URINE PER MICROSCOPY: 5
Glucose, UA: NEGATIVE
KETONES UA: NEGATIVE
MUCUS UA: 0
NITRITE UA: NEGATIVE
PROTEIN UA: NEGATIVE
RBC: 0 M/uL — AB (ref 4.04–5.48)
Spec Grav, UA: 1.01
Urobilinogen, UA: 0.2
WBC Casts, UA: 5
pH, UA: 6

## 2016-10-23 MED ORDER — ATORVASTATIN CALCIUM 40 MG PO TABS: 40.0000 mg | ORAL_TABLET | Freq: Every day | ORAL | 1 refills | Status: DC

## 2016-10-23 MED ORDER — LEVOTHYROXINE SODIUM 100 MCG PO TABS: 100.0000 ug | ORAL_TABLET | Freq: Every day | ORAL | 1 refills | Status: DC

## 2016-10-23 NOTE — Progress Notes (Signed)
Patient: Kelsey Pacheco, Female    DOB: 1941/06/28, 75 y.o.   MRN: [id number] Visit Date: 10/23/2016  Today's Provider: Bari Edward, MD   Chief Complaint  Patient presents with  . medicare annual wellness   Subjective:    Annual wellness visit Kelsey Pacheco is a 75 y.o. female who presents today for her Subsequent Annual Wellness Visit. She feels well. She reports exercising walking regularly. She reports she is sleeping well. She is due for Mammogram in Feb.  She denies breast issues.  ----------------------------------------------------------- Hyperlipidemia  This is a chronic problem. The problem is controlled. Recent lipid tests were reviewed and are normal. Associated symptoms include myalgias. Pertinent negatives include no chest pain or shortness of breath. Current antihyperlipidemic treatment includes statins.  Thyroid Problem  Presents for follow-up visit. Patient reports no anxiety, constipation, diarrhea, fatigue, hoarse voice, leg swelling, palpitations, tremors or weight loss. Her past medical history is significant for hyperlipidemia.  Osteopenia - having muscle aches and pain from Fosamax - hurts for several days after the dose.  Mild decrease in hip density from 2014 to 2017. HM - she had a nurse home visit.  All was normal except for a Quantiflo test suggesting mild PAD on the left.  She denies leg heaviness or cramping with exertion.  She walks daily and is only limited by hip pain.  Review of Systems  Constitutional: Negative for chills, fatigue, fever and weight loss.  HENT: Negative for congestion, hearing loss, hoarse voice, tinnitus, trouble swallowing and voice change.   Eyes: Negative for visual disturbance.  Respiratory: Negative for cough, chest tightness, shortness of breath and wheezing.   Cardiovascular: Negative for chest pain, palpitations and leg swelling.  Gastrointestinal: Negative for abdominal pain, constipation, diarrhea and vomiting.    Endocrine: Negative for polydipsia and polyuria.  Genitourinary: Positive for urgency. Negative for dysuria, frequency, genital sores, vaginal bleeding and vaginal discharge.  Musculoskeletal: Positive for arthralgias and myalgias. Negative for gait problem and joint swelling.  Skin: Negative for color change and rash.  Neurological: Negative for dizziness, tremors, light-headedness and headaches.  Hematological: Negative for adenopathy. Does not bruise/bleed easily.  Psychiatric/Behavioral: Negative for dysphoric mood and sleep disturbance. The patient is not nervous/anxious.     Social History   Social History  . Marital status: Widowed    Spouse name: N/A  . Number of children: 1  . Years of education: HS   Occupational History  .      RETIRED   Social History Main Topics  . Smoking status: Never Smoker  . Smokeless tobacco: Never Used  . Alcohol use No  . Drug use: No  . Sexual activity: No   Other Topics Concern  . Not on file   Social History Narrative  . No narrative on file    Patient Active Problem List   Diagnosis Date Noted  . Left-sided low back pain with left-sided sciatica 04/01/2016  . Osteopenia 11/20/2015  . Allergic rhinitis 11/15/2015  . Body mass index (BMI) of 25.0-25.9 in adult 11/15/2015  . Blood in the urine 11/15/2015  . Prediabetes 11/15/2015  . Adult hypothyroidism 10/16/2015  . Hyperlipidemia 06/24/2015    Past Surgical History:  Procedure Laterality Date  . BASAL CELL CARCINOMA EXCISION  06/04/11    [facility] (Mohs procedure) left orbital Dr. Cheryll Dessert  . BREAST BIOPSY Right 2006   bx/clip-neg  . Cologuard home test  12/2015   Negative    Her family history includes Dementia in her  father; Heart attack in her mother; Heart disease in her father.     Previous Medications   ALENDRONATE (FOSAMAX) 70 MG TABLET    Take 1 tablet (70 mg total) by mouth every 7 (seven) days. Take with a full glass of water on an empty stomach.   ASCORBIC ACID  (VITAMIN C) 100 MG TABLET    Take 1 tablet by mouth daily.   ASPIRIN 81 MG TABLET    Take 1 tablet by mouth daily.   ATORVASTATIN (LIPITOR) 40 MG TABLET    TAKE 1 TABLET BY MOUTH EVERY DAY   CALCIUM CARBONATE (CALCIUM 600 PO)    Take 1 tablet by mouth daily.   LEVOTHYROXINE (SYNTHROID, LEVOTHROID) 100 MCG TABLET    TAKE 1 TABLET BY MOUTH EVERY DAY   MULTIPLE VITAMIN PO    Take 1 tablet by mouth daily.   VITAMIN D, CHOLECALCIFEROL, 1000 UNITS TABS    Take 1 tablet by mouth daily.   VITAMIN E 100 UNITS TABS    Take 1 capsule by mouth daily.    Patient Care Team: Reubin MilanLaura H Martise Waddell, MD as PCP - General (Family Medicine)      Objective:   Vitals: BP 120/82   Pulse 72   Ht 5\' 6"  (1.676 m)   Wt 154 lb (69.9 kg)   BMI 24.86 kg/m   Physical Exam  Constitutional: She is oriented to person, place, and time. She appears well-developed and well-nourished. No distress.  HENT:  Head: Normocephalic and atraumatic.  Left Ear: Tympanic membrane and ear canal normal.  Nose: Right sinus exhibits no maxillary sinus tenderness. Left sinus exhibits no maxillary sinus tenderness.  Mouth/Throat: Uvula is midline and oropharynx is clear and moist.  Right canal with excessive cerumen  Eyes: Conjunctivae and EOM are normal. Right eye exhibits no discharge. Left eye exhibits no discharge. No scleral icterus.  Neck: Normal range of motion. Carotid bruit is not present. No erythema present. No thyromegaly present.  Cardiovascular: Normal rate, regular rhythm and normal heart sounds.   Pulses:      Dorsalis pedis pulses are 1+ on the right side, and 1+ on the left side.       Posterior tibial pulses are 2+ on the right side, and 2+ on the left side.  Pulmonary/Chest: Effort normal. No respiratory distress. She has no wheezes.  Abdominal: Soft. Bowel sounds are normal. There is no hepatosplenomegaly. There is no tenderness. There is no CVA tenderness.  Musculoskeletal: Normal range of motion.  Lymphadenopathy:     She has no cervical adenopathy.    She has no axillary adenopathy.  Neurological: She is alert and oriented to person, place, and time. She has normal reflexes. No cranial nerve deficit or sensory deficit.  Skin: Skin is warm, dry and intact. No rash noted.  Psychiatric: She has a normal mood and affect. Her speech is normal and behavior is normal. Judgment and thought content normal. Cognition and memory are normal.  Nursing note and vitals reviewed.   Activities of Daily Living In your present state of health, do you have any difficulty performing the following activities: 10/23/2016 04/01/2016  Hearing? N N  Vision? N N  Difficulty concentrating or making decisions? N N  Walking or climbing stairs? N N  Dressing or bathing? N N  Doing errands, shopping? N N  Preparing Food and eating ? N -  Using the Toilet? N -  In the past six months, have you accidently leaked urine? Y -  Do you have problems with loss of bowel control? N -  Managing your Medications? N -  Managing your Finances? N -  Housekeeping or managing your Housekeeping? N -  Some recent data might be hidden    Fall Risk Assessment Fall Risk  10/23/2016 04/01/2016 11/20/2015  Falls in the past year? No No No      Depression Screen PHQ 2/9 Scores 10/23/2016 04/01/2016 11/20/2015  PHQ - 2 Score 0 0 0    6CIT Screen 10/23/2016  What Year? 0 points  What month? 0 points  What time? 0 points  Count back from 20 0 points  Months in reverse 0 points  Repeat phrase 0 points  Total Score 0      Medicare Annual Wellness Visit Summary:  Reviewed patient's Family Medical History Reviewed and updated list of patient's medical providers Assessment of cognitive impairment was done Assessed patient's functional ability Established a written schedule for health screening services Health Risk Assessent Completed and Reviewed  Exercise Activities and Dietary recommendations Goals    None      Immunization  History  Administered Date(s) Administered  . Influenza,inj,Quad PF,36+ Mos 09/21/2016  . Influenza-Unspecified 08/23/2013  . Pneumococcal Conjugate-13 04/23/2014  . Pneumococcal Polysaccharide-23 09/14/2007  . Td 09/14/2007, 08/29/2011  . Tdap 08/29/2011  . Zoster 05/30/2011    Health Maintenance  Topic Date Due  . Janet BerlinETANUS/TDAP  08/28/2021  . COLONOSCOPY  10/23/2025  . INFLUENZA VACCINE  Completed  . DEXA SCAN  Completed  . ZOSTAVAX  Completed  . PNA vac Low Risk Adult  Completed    Discussed health benefits of physical activity, and encouraged her to engage in regular exercise appropriate for her age and condition.    ------------------------------------------------------------------------------------------------------------  Assessment & Plan:   1. Medicare annual wellness visit, subsequent Measures satisfied No evidence of significant PAD on exam or by symptoms Continue regular exercise/non smoking - POCT urinalysis dipstick  2. Adult hypothyroidism supplemented - TSH  3. Mixed hyperlipidemia On statin therapy - Comprehensive metabolic panel - Lipid panel  4. Osteopenia, unspecified location Continue calcium and vitamin D Stop Fosamax due to myalgias- repeat DEXA in 18 months  5. Encounter for screening mammogram for breast cancer - MM DIGITAL SCREENING BILATERAL; Future    Bari EdwardLaura Samani Deal, MD [facility]  10/23/2016

## 2016-10-23 NOTE — Patient Instructions (Addendum)
Health Maintenance  Topic Date Due  . TETANUS/TDAP  08/28/2021  . COLONOSCOPY  10/23/2025  . INFLUENZA VACCINE  Completed  . DEXA SCAN  Completed  . ZOSTAVAX  Completed  . PNA vac Low Risk Adult  Completed    Breast Self-Awareness Introduction Breast self-awareness means being familiar with how your breasts look and feel. It involves checking your breasts regularly and reporting any changes to your health care provider. Practicing breast self-awareness is important. A change in your breasts can be a sign of a serious medical problem. Being familiar with how your breasts look and feel allows you to find any problems early, when treatment is more likely to be successful. All women should practice breast self-awareness, including women who have had breast implants. How to do a breast self-exam One way to learn what is normal for your breasts and whether your breasts are changing is to do a breast self-exam. To do a breast self-exam: Look for Changes  1. Remove all the clothing above your waist. 2. Stand in front of a mirror in a room with good lighting. 3. Put your hands on your hips. 4. Push your hands firmly downward. 5. Compare your breasts in the mirror. Look for differences between them (asymmetry), such as:  Differences in shape.  Differences in size.  Puckers, dips, and bumps in one breast and not the other. 6. Look at each breast for changes in your skin, such as:  Redness.  Scaly areas. 7. Look for changes in your nipples, such as:  Discharge.  Bleeding.  Dimpling.  Redness.  A change in position. Feel for Changes  Carefully feel your breasts for lumps and changes. It is best to do this while lying on your back on the floor and again while sitting or standing in the shower or tub with soapy water on your skin. Feel each breast in the following way:  Place the arm on the side of the breast you are examining above your head.  Feel your breast with the other  hand.  Start in the nipple area and make  inch (2 cm) overlapping circles to feel your breast. Use the pads of your three middle fingers to do this. Apply light pressure, then medium pressure, then firm pressure. The light pressure will allow you to feel the tissue closest to the skin. The medium pressure will allow you to feel the tissue that is a little deeper. The firm pressure will allow you to feel the tissue close to the ribs.  Continue the overlapping circles, moving downward over the breast until you feel your ribs below your breast.  Move one finger-width toward the center of the body. Continue to use the  inch (2 cm) overlapping circles to feel your breast as you move slowly up toward your collarbone.  Continue the up and down exam using all three pressures until you reach your armpit. Write Down What You Find  Write down what is normal for each breast and any changes that you find. Keep a written record with breast changes or normal findings for each breast. By writing this information down, you do not need to depend only on memory for size, tenderness, or location. Write down where you are in your menstrual cycle, if you are still menstruating. If you are having trouble noticing differences in your breasts, do not get discouraged. With time you will become more familiar with the variations in your breasts and more comfortable with the exam. How often should  I examine my breasts? Examine your breasts every month. If you are breastfeeding, the best time to examine your breasts is after a feeding or after using a breast pump. If you menstruate, the best time to examine your breasts is 5-7 days after your period is over. During your period, your breasts are lumpier, and it may be more difficult to notice changes. When should I see my health care provider? See your health care provider if you notice:  A change in shape or size of your breasts or nipples.  A change in the skin of your  breast or nipples, such as a reddened or scaly area.  Unusual discharge from your nipples.  A lump or thick area that was not there before.  Pain in your breasts.  Anything that concerns you. This information is not intended to replace advice given to you by your health care provider. Make sure you discuss any questions you have with your health care provider. Document Released: 11/09/2005 Document Revised: 04/16/2016 Document Reviewed: 09/29/2015  2017 Elsevier

## 2016-10-24 LAB — CBC WITH DIFFERENTIAL/PLATELET
BASOS ABS: 0 10*3/uL (ref 0.0–0.2)
Basos: 1 %
EOS (ABSOLUTE): 0.2 10*3/uL (ref 0.0–0.4)
Eos: 3 %
Hematocrit: 40.6 % (ref 34.0–46.6)
Hemoglobin: 13.5 g/dL (ref 11.1–15.9)
IMMATURE GRANULOCYTES: 0 %
Immature Grans (Abs): 0 10*3/uL (ref 0.0–0.1)
Lymphocytes Absolute: 1.6 10*3/uL (ref 0.7–3.1)
Lymphs: 26 %
MCH: 30.2 pg (ref 26.6–33.0)
MCHC: 33.3 g/dL (ref 31.5–35.7)
MCV: 91 fL (ref 79–97)
MONOS ABS: 0.4 10*3/uL (ref 0.1–0.9)
Monocytes: 7 %
Neutrophils Absolute: 4 10*3/uL (ref 1.4–7.0)
Neutrophils: 63 %
PLATELETS: 252 10*3/uL (ref 150–379)
RBC: 4.47 x10E6/uL (ref 3.77–5.28)
RDW: 13.8 % (ref 12.3–15.4)
WBC: 6.2 10*3/uL (ref 3.4–10.8)

## 2016-10-24 LAB — COMPREHENSIVE METABOLIC PANEL
A/G RATIO: 2 (ref 1.2–2.2)
ALT: 30 IU/L (ref 0–32)
AST: 27 IU/L (ref 0–40)
Albumin: 4.7 g/dL (ref 3.5–4.8)
Alkaline Phosphatase: 101 IU/L (ref 39–117)
BUN/Creatinine Ratio: 18 (ref 12–28)
BUN: 15 mg/dL (ref 8–27)
Bilirubin Total: 0.5 mg/dL (ref 0.0–1.2)
CALCIUM: 9.4 mg/dL (ref 8.7–10.3)
CO2: 24 mmol/L (ref 18–29)
CREATININE: 0.84 mg/dL (ref 0.57–1.00)
Chloride: 101 mmol/L (ref 96–106)
GFR calc Af Amer: 79 mL/min/{1.73_m2} (ref >59)
GFR, EST NON AFRICAN AMERICAN: 68 mL/min/{1.73_m2} (ref >59)
GLUCOSE: 111 mg/dL — AB (ref 65–99)
Globulin, Total: 2.3 g/dL (ref 1.5–4.5)
POTASSIUM: 4.5 mmol/L (ref 3.5–5.2)
Sodium: 143 mmol/L (ref 134–144)
Total Protein: 7 g/dL (ref 6.0–8.5)

## 2016-10-24 LAB — LIPID PANEL
CHOL/HDL RATIO: 2.5 ratio (ref 0.0–4.4)
Cholesterol, Total: 210 mg/dL — ABNORMAL HIGH (ref 100–199)
HDL: 85 mg/dL (ref >39)
LDL Calculated: 105 mg/dL — ABNORMAL HIGH (ref 0–99)
TRIGLYCERIDES: 100 mg/dL (ref 0–149)
VLDL Cholesterol Cal: 20 mg/dL (ref 5–40)

## 2016-10-24 LAB — HEMOGLOBIN A1C
ESTIMATED AVERAGE GLUCOSE: 131 mg/dL
HEMOGLOBIN A1C: 6.2 % — AB (ref 4.8–5.6)

## 2016-10-24 LAB — TSH: TSH: 2.46 u[IU]/mL (ref 0.450–4.500)

## 2016-11-23 HISTORY — PX: RETINAL DETACHMENT SURGERY: SHX105

## 2016-11-25 DIAGNOSIS — M9902 Segmental and somatic dysfunction of thoracic region: Secondary | ICD-10-CM | POA: Diagnosis not present

## 2016-11-25 DIAGNOSIS — M955 Acquired deformity of pelvis: Secondary | ICD-10-CM | POA: Diagnosis not present

## 2016-11-25 DIAGNOSIS — M9903 Segmental and somatic dysfunction of lumbar region: Secondary | ICD-10-CM | POA: Diagnosis not present

## 2016-11-25 DIAGNOSIS — M9905 Segmental and somatic dysfunction of pelvic region: Secondary | ICD-10-CM | POA: Diagnosis not present

## 2016-11-25 DIAGNOSIS — M5116 Intervertebral disc disorders with radiculopathy, lumbar region: Secondary | ICD-10-CM | POA: Diagnosis not present

## 2016-12-06 ENCOUNTER — Other Ambulatory Visit: Payer: Self-pay | Admitting: Internal Medicine

## 2016-12-10 ENCOUNTER — Other Ambulatory Visit: Payer: Self-pay | Admitting: Internal Medicine

## 2016-12-10 ENCOUNTER — Other Ambulatory Visit: Payer: Self-pay | Admitting: Family Medicine

## 2016-12-10 DIAGNOSIS — M810 Age-related osteoporosis without current pathological fracture: Secondary | ICD-10-CM

## 2016-12-16 DIAGNOSIS — H33001 Unspecified retinal detachment with retinal break, right eye: Secondary | ICD-10-CM | POA: Diagnosis not present

## 2016-12-18 DIAGNOSIS — H33001 Unspecified retinal detachment with retinal break, right eye: Secondary | ICD-10-CM | POA: Diagnosis not present

## 2016-12-21 DIAGNOSIS — H33001 Unspecified retinal detachment with retinal break, right eye: Secondary | ICD-10-CM | POA: Diagnosis not present

## 2016-12-21 DIAGNOSIS — H3322 Serous retinal detachment, left eye: Secondary | ICD-10-CM | POA: Diagnosis not present

## 2016-12-21 DIAGNOSIS — H3321 Serous retinal detachment, right eye: Secondary | ICD-10-CM | POA: Diagnosis not present

## 2016-12-21 DIAGNOSIS — H269 Unspecified cataract: Secondary | ICD-10-CM | POA: Diagnosis not present

## 2017-01-04 ENCOUNTER — Ambulatory Visit
Admission: RE | Admit: 2017-01-04 | Discharge: 2017-01-04 | Disposition: A | Discharge status: Home / Self Care | Payer: Medicare Other | Source: Ambulatory Visit | Attending: Internal Medicine | Admitting: Internal Medicine

## 2017-01-04 DIAGNOSIS — Z1231 Encounter for screening mammogram for malignant neoplasm of breast: Secondary | ICD-10-CM | POA: Diagnosis not present

## 2017-01-13 DIAGNOSIS — M9903 Segmental and somatic dysfunction of lumbar region: Secondary | ICD-10-CM | POA: Diagnosis not present

## 2017-01-13 DIAGNOSIS — M5116 Intervertebral disc disorders with radiculopathy, lumbar region: Secondary | ICD-10-CM | POA: Diagnosis not present

## 2017-01-13 DIAGNOSIS — M9905 Segmental and somatic dysfunction of pelvic region: Secondary | ICD-10-CM | POA: Diagnosis not present

## 2017-01-13 DIAGNOSIS — M955 Acquired deformity of pelvis: Secondary | ICD-10-CM | POA: Diagnosis not present

## 2017-01-13 DIAGNOSIS — M9902 Segmental and somatic dysfunction of thoracic region: Secondary | ICD-10-CM | POA: Diagnosis not present

## 2017-02-03 DIAGNOSIS — M955 Acquired deformity of pelvis: Secondary | ICD-10-CM | POA: Diagnosis not present

## 2017-02-03 DIAGNOSIS — M9905 Segmental and somatic dysfunction of pelvic region: Secondary | ICD-10-CM | POA: Diagnosis not present

## 2017-02-03 DIAGNOSIS — M9903 Segmental and somatic dysfunction of lumbar region: Secondary | ICD-10-CM | POA: Diagnosis not present

## 2017-02-03 DIAGNOSIS — M9902 Segmental and somatic dysfunction of thoracic region: Secondary | ICD-10-CM | POA: Diagnosis not present

## 2017-02-03 DIAGNOSIS — M5116 Intervertebral disc disorders with radiculopathy, lumbar region: Secondary | ICD-10-CM | POA: Diagnosis not present

## 2017-02-17 DIAGNOSIS — M9905 Segmental and somatic dysfunction of pelvic region: Secondary | ICD-10-CM | POA: Diagnosis not present

## 2017-02-17 DIAGNOSIS — M9902 Segmental and somatic dysfunction of thoracic region: Secondary | ICD-10-CM | POA: Diagnosis not present

## 2017-02-17 DIAGNOSIS — M955 Acquired deformity of pelvis: Secondary | ICD-10-CM | POA: Diagnosis not present

## 2017-02-17 DIAGNOSIS — M9903 Segmental and somatic dysfunction of lumbar region: Secondary | ICD-10-CM | POA: Diagnosis not present

## 2017-02-17 DIAGNOSIS — M5116 Intervertebral disc disorders with radiculopathy, lumbar region: Secondary | ICD-10-CM | POA: Diagnosis not present

## 2017-03-03 DIAGNOSIS — M9903 Segmental and somatic dysfunction of lumbar region: Secondary | ICD-10-CM | POA: Diagnosis not present

## 2017-03-03 DIAGNOSIS — M5116 Intervertebral disc disorders with radiculopathy, lumbar region: Secondary | ICD-10-CM | POA: Diagnosis not present

## 2017-03-03 DIAGNOSIS — M9902 Segmental and somatic dysfunction of thoracic region: Secondary | ICD-10-CM | POA: Diagnosis not present

## 2017-03-03 DIAGNOSIS — M955 Acquired deformity of pelvis: Secondary | ICD-10-CM | POA: Diagnosis not present

## 2017-03-03 DIAGNOSIS — M9905 Segmental and somatic dysfunction of pelvic region: Secondary | ICD-10-CM | POA: Diagnosis not present

## 2017-03-11 ENCOUNTER — Other Ambulatory Visit: Payer: Self-pay | Admitting: Internal Medicine

## 2017-03-17 DIAGNOSIS — M9903 Segmental and somatic dysfunction of lumbar region: Secondary | ICD-10-CM | POA: Diagnosis not present

## 2017-03-17 DIAGNOSIS — M955 Acquired deformity of pelvis: Secondary | ICD-10-CM | POA: Diagnosis not present

## 2017-03-17 DIAGNOSIS — M9905 Segmental and somatic dysfunction of pelvic region: Secondary | ICD-10-CM | POA: Diagnosis not present

## 2017-03-17 DIAGNOSIS — M5116 Intervertebral disc disorders with radiculopathy, lumbar region: Secondary | ICD-10-CM | POA: Diagnosis not present

## 2017-03-17 DIAGNOSIS — M9902 Segmental and somatic dysfunction of thoracic region: Secondary | ICD-10-CM | POA: Diagnosis not present

## 2017-04-07 DIAGNOSIS — M9902 Segmental and somatic dysfunction of thoracic region: Secondary | ICD-10-CM | POA: Diagnosis not present

## 2017-04-07 DIAGNOSIS — M9905 Segmental and somatic dysfunction of pelvic region: Secondary | ICD-10-CM | POA: Diagnosis not present

## 2017-04-07 DIAGNOSIS — M5116 Intervertebral disc disorders with radiculopathy, lumbar region: Secondary | ICD-10-CM | POA: Diagnosis not present

## 2017-04-07 DIAGNOSIS — M955 Acquired deformity of pelvis: Secondary | ICD-10-CM | POA: Diagnosis not present

## 2017-04-07 DIAGNOSIS — M9903 Segmental and somatic dysfunction of lumbar region: Secondary | ICD-10-CM | POA: Diagnosis not present

## 2017-04-08 DIAGNOSIS — M5116 Intervertebral disc disorders with radiculopathy, lumbar region: Secondary | ICD-10-CM | POA: Diagnosis not present

## 2017-04-08 DIAGNOSIS — M9902 Segmental and somatic dysfunction of thoracic region: Secondary | ICD-10-CM | POA: Diagnosis not present

## 2017-04-08 DIAGNOSIS — M9903 Segmental and somatic dysfunction of lumbar region: Secondary | ICD-10-CM | POA: Diagnosis not present

## 2017-04-08 DIAGNOSIS — M955 Acquired deformity of pelvis: Secondary | ICD-10-CM | POA: Diagnosis not present

## 2017-04-08 DIAGNOSIS — M9905 Segmental and somatic dysfunction of pelvic region: Secondary | ICD-10-CM | POA: Diagnosis not present

## 2017-05-05 DIAGNOSIS — M9903 Segmental and somatic dysfunction of lumbar region: Secondary | ICD-10-CM | POA: Diagnosis not present

## 2017-05-05 DIAGNOSIS — M955 Acquired deformity of pelvis: Secondary | ICD-10-CM | POA: Diagnosis not present

## 2017-05-05 DIAGNOSIS — M5116 Intervertebral disc disorders with radiculopathy, lumbar region: Secondary | ICD-10-CM | POA: Diagnosis not present

## 2017-05-05 DIAGNOSIS — M9902 Segmental and somatic dysfunction of thoracic region: Secondary | ICD-10-CM | POA: Diagnosis not present

## 2017-05-05 DIAGNOSIS — M9905 Segmental and somatic dysfunction of pelvic region: Secondary | ICD-10-CM | POA: Diagnosis not present

## 2017-06-09 DIAGNOSIS — M9902 Segmental and somatic dysfunction of thoracic region: Secondary | ICD-10-CM | POA: Diagnosis not present

## 2017-06-09 DIAGNOSIS — M9903 Segmental and somatic dysfunction of lumbar region: Secondary | ICD-10-CM | POA: Diagnosis not present

## 2017-06-09 DIAGNOSIS — M5116 Intervertebral disc disorders with radiculopathy, lumbar region: Secondary | ICD-10-CM | POA: Diagnosis not present

## 2017-06-09 DIAGNOSIS — M9905 Segmental and somatic dysfunction of pelvic region: Secondary | ICD-10-CM | POA: Diagnosis not present

## 2017-06-09 DIAGNOSIS — M955 Acquired deformity of pelvis: Secondary | ICD-10-CM | POA: Diagnosis not present

## 2017-06-18 DIAGNOSIS — H33001 Unspecified retinal detachment with retinal break, right eye: Secondary | ICD-10-CM | POA: Diagnosis not present

## 2017-07-07 DIAGNOSIS — M9903 Segmental and somatic dysfunction of lumbar region: Secondary | ICD-10-CM | POA: Diagnosis not present

## 2017-07-07 DIAGNOSIS — M9902 Segmental and somatic dysfunction of thoracic region: Secondary | ICD-10-CM | POA: Diagnosis not present

## 2017-07-07 DIAGNOSIS — M955 Acquired deformity of pelvis: Secondary | ICD-10-CM | POA: Diagnosis not present

## 2017-07-07 DIAGNOSIS — M5116 Intervertebral disc disorders with radiculopathy, lumbar region: Secondary | ICD-10-CM | POA: Diagnosis not present

## 2017-07-07 DIAGNOSIS — M9905 Segmental and somatic dysfunction of pelvic region: Secondary | ICD-10-CM | POA: Diagnosis not present

## 2017-07-16 DIAGNOSIS — M9903 Segmental and somatic dysfunction of lumbar region: Secondary | ICD-10-CM | POA: Diagnosis not present

## 2017-07-16 DIAGNOSIS — M955 Acquired deformity of pelvis: Secondary | ICD-10-CM | POA: Diagnosis not present

## 2017-07-16 DIAGNOSIS — M5116 Intervertebral disc disorders with radiculopathy, lumbar region: Secondary | ICD-10-CM | POA: Diagnosis not present

## 2017-07-16 DIAGNOSIS — M9905 Segmental and somatic dysfunction of pelvic region: Secondary | ICD-10-CM | POA: Diagnosis not present

## 2017-07-16 DIAGNOSIS — M9902 Segmental and somatic dysfunction of thoracic region: Secondary | ICD-10-CM | POA: Diagnosis not present

## 2017-08-11 DIAGNOSIS — M9905 Segmental and somatic dysfunction of pelvic region: Secondary | ICD-10-CM | POA: Diagnosis not present

## 2017-08-11 DIAGNOSIS — M955 Acquired deformity of pelvis: Secondary | ICD-10-CM | POA: Diagnosis not present

## 2017-08-11 DIAGNOSIS — M9903 Segmental and somatic dysfunction of lumbar region: Secondary | ICD-10-CM | POA: Diagnosis not present

## 2017-08-11 DIAGNOSIS — M5116 Intervertebral disc disorders with radiculopathy, lumbar region: Secondary | ICD-10-CM | POA: Diagnosis not present

## 2017-08-11 DIAGNOSIS — M9902 Segmental and somatic dysfunction of thoracic region: Secondary | ICD-10-CM | POA: Diagnosis not present

## 2017-09-06 ENCOUNTER — Ambulatory Visit (INDEPENDENT_AMBULATORY_CARE_PROVIDER_SITE_OTHER): Payer: Medicare Other

## 2017-09-06 VITALS — BP 138/72 | HR 73 | Temp 98.9°F | Resp 16 | Ht 66.0 in | Wt 155.0 lb

## 2017-09-06 DIAGNOSIS — Z0000 Encounter for general adult medical examination without abnormal findings: Secondary | ICD-10-CM | POA: Diagnosis not present

## 2017-09-06 DIAGNOSIS — Z23 Encounter for immunization: Secondary | ICD-10-CM

## 2017-09-06 NOTE — Progress Notes (Signed)
Subjective:   Kelsey Pacheco is a 76 y.o. female who presents for Medicare Annual (Subsequent) preventive examination.  Review of Systems:  N/A Cardiac Risk Factors include: advanced age (>80men, >60 women);dyslipidemia     Objective:     Vitals: BP 138/72 (BP Location: Left Arm, Patient Position: Sitting, Cuff Size: Normal)   Pulse 73   Temp 98.9 F (37.2 C) (Oral)   Resp 16   Ht  (1.676 m)   Wt 155 lb (70.3 kg)   BMI 25.02 kg/m   Body mass index is 25.02 kg/m.   Tobacco History  Smoking Status  . Never Smoker  Smokeless Tobacco  . Never Used     Counseling given: Not Answered   Past Medical History:  Diagnosis Date  . Hyperlipidemia   . Osteoporosis   . Thyroid disease   . Vitamin D deficiency    Past Surgical History:  Procedure Laterality Date  . BASAL CELL CARCINOMA EXCISION  06/04/11    [facility] (Mohs procedure) left orbital Dr. Cheryll Dessert  . BREAST BIOPSY Right 2006   bx/clip-neg  . Cologuard home test  12/2015   Negative  . RETINAL DETACHMENT SURGERY Right    Family History  Problem Relation Age of Onset  . Dementia Father   . Heart disease Father   . Heart attack Mother   . Breast cancer Neg Hx    History  Sexual Activity  . Sexual activity: No    Outpatient Encounter Prescriptions as of 09/06/2017  Medication Sig  . Ascorbic Acid (VITAMIN C) 100 MG tablet Take 1 tablet by mouth daily.  Marland Kitchen aspirin 81 MG tablet Take 1 tablet by mouth daily.  Marland Kitchen atorvastatin (LIPITOR) 40 MG tablet Take 1 tablet (40 mg total) by mouth daily.  . Calcium Carbonate (CALCIUM 600 PO) Take 1 tablet by mouth daily.  Marland Kitchen levothyroxine (SYNTHROID, LEVOTHROID) 100 MCG tablet Take 1 tablet (100 mcg total) by mouth daily.  . MULTIPLE VITAMIN PO Take 1 tablet by mouth daily.  . Vitamin D, Cholecalciferol, 1000 UNITS TABS Take 1 tablet by mouth daily.  . Vitamin E 100 UNITS TABS Take 1 capsule by mouth daily.  . [DISCONTINUED] atorvastatin (LIPITOR) 40 MG tablet TAKE 1  TABLET BY MOUTH EVERY DAY  . [DISCONTINUED] levothyroxine (SYNTHROID, LEVOTHROID) 100 MCG tablet TAKE 1 TABLET BY MOUTH EVERY DAY   No facility-administered encounter medications on file as of 09/06/2017.     Activities of Daily Living In your present state of health, do you have any difficulty performing the following activities: 09/06/2017 10/23/2016  Hearing? N N  Vision? N N  Difficulty concentrating or making decisions? N N  Walking or climbing stairs? N N  Dressing or bathing? N N  Doing errands, shopping? N N  Preparing Food and eating ? N N  Using the Toilet? N N  In the past six months, have you accidently leaked urine? N Y  Comment - wearing a pad  Do you have problems with loss of bowel control? N N  Managing your Medications? N N  Managing your Finances? N N  Housekeeping or managing your Housekeeping? N N  Some recent data might be hidden    Patient Care Team: Reubin Milan, MD as PCP - General (Family Medicine)    Assessment:     Exercise Activities and Dietary recommendations Current Exercise Habits: Structured exercise class, Type of exercise: walking (stationary bike), Time (Minutes): 20, Frequency (Times/Week): 6, Weekly Exercise (Minutes/Week): 120,  Intensity: Mild, Exercise limited by: None identified  Goals    . Eat more fruits and vegetables          Recommend to increase fruit and vegetable servings to 5 per day      Fall Risk Fall Risk  09/06/2017 10/23/2016 04/01/2016 11/20/2015  Falls in the past year? No No No No   Depression Screen PHQ 2/9 Scores 09/06/2017 10/23/2016 04/01/2016 11/20/2015  PHQ - 2 Score 0 0 0 0     Cognitive Function     6CIT Screen 09/06/2017 10/23/2016  What Year? 0 points 0 points  What month? 0 points 0 points  What time? 0 points 0 points  Count back from 20 0 points 0 points  Months in reverse 0 points 0 points  Repeat phrase 4 points 0 points  Total Score 4 0    Immunization History  Administered  Date(s) Administered  . Influenza, High Dose Seasonal PF 09/06/2017  . Influenza,inj,Quad PF,6+ Mos 09/21/2016  . Influenza-Unspecified 08/23/2013  . Pneumococcal Conjugate-13 04/23/2014  . Pneumococcal Polysaccharide-23 09/14/2007  . Td 09/14/2007, 08/29/2011  . Tdap 08/29/2011  . Zoster 05/30/2011   Screening Tests Health Maintenance  Topic Date Due  . TETANUS/TDAP  08/28/2021  . INFLUENZA VACCINE  Completed  . DEXA SCAN  Completed  . PNA vac Low Risk Adult  Completed      Plan:    I have personally reviewed and addressed the Medicare Annual Wellness questionnaire and have noted the following in the patient's chart:  A. Medical and social history B. Use of alcohol, tobacco or illicit drugs  C. Current medications and supplements D. Functional ability and status E.  Nutritional status F.  Physical activity G. Advance directives H. List of other physicians I.  Hospitalizations, surgeries, and ER visits in previous 12 months J.  Vitals K. Screenings such as hearing and vision if needed, cognitive and depression L. Referrals and appointments - none  In addition, I have reviewed and discussed with patient certain preventive protocols, quality metrics, and best practice recommendations. A written personalized care plan for preventive services as well as general preventive health recommendations were provided to patient.  See attached scanned questionnaire for additional information.   Signed,  Deon Pilling, LPN Nurse Health Advisor   MD Recommendations: None

## 2017-09-06 NOTE — Patient Instructions (Addendum)
Kelsey Pacheco , Thank you for taking time to come for your Medicare Wellness Visit. I appreciate your ongoing commitment to your health goals. Please review the following plan we discussed and let me know if I can assist you in the future.   Screening recommendations/referrals: Colonoscopy: Completed 10/24/15 Mammogram: Completed 01/04/17 Bone Density: Completed 01/01/16 Recommended yearly ophthalmology/optometry visit for glaucoma screening and checkup Recommended yearly dental visit for hygiene and checkup  Vaccinations: Influenza vaccine: Given today Pneumococcal vaccine: Completed series Tdap vaccine: Up to date Shingles vaccine: Completed. Please provide our office with a copy of your immunization record.   Advanced directives: Please bring a copy of your POA (Power of Attorney) and/or Living Will to your next appointment.   Conditions/risks identified: Recommend to increase fruit and vegetable servings to 5 per day; Fall risk prevention discussed  Next appointment: Scheduled to see Dr. Judithann Pacheco on 10/25/17 @ 8:30am. Follow in one year for your annual wellness exam.   Preventive Care 76 Years and Older, Female Preventive care refers to lifestyle choices and visits with your health care provider that can promote health and wellness. What does preventive care include?  A yearly physical exam. This is also called an annual well check.  Dental exams once or twice a year.  Routine eye exams. Ask your health care provider how often you should have your eyes checked.  Personal lifestyle choices, including:  Daily care of your teeth and gums.  Regular physical activity.  Eating a healthy diet.  Avoiding tobacco and drug use.  Limiting alcohol use.  Practicing safe sex.  Taking low-dose aspirin every day.  Taking vitamin and mineral supplements as recommended by your health care provider. What happens during an annual well check? The services and screenings done by your health  care provider during your annual well check will depend on your age, overall health, lifestyle risk factors, and family history of disease. Counseling  Your health care provider may ask you questions about your:  Alcohol use.  Tobacco use.  Drug use.  Emotional well-being.  Home and relationship well-being.  Sexual activity.  Eating habits.  History of falls.  Memory and ability to understand (cognition).  Work and work Astronomer.  Reproductive health. Screening  You may have the following tests or measurements:  Height, weight, and BMI.  Blood pressure.  Lipid and cholesterol levels. These may be checked every 5 years, or more frequently if you are over 68 years old.  Skin check.  Lung cancer screening. You may have this screening every year starting at age 76 if you have a 30-pack-year history of smoking and currently smoke or have quit within the past 15 years.  Fecal occult blood test (FOBT) of the stool. You may have this test every year starting at age 76.  Flexible sigmoidoscopy or colonoscopy. You may have a sigmoidoscopy every 5 years or a colonoscopy every 10 years starting at age 76.  Hepatitis C blood test.  Hepatitis B blood test.  Sexually transmitted disease (STD) testing.  Diabetes screening. This is done by checking your blood sugar (glucose) after you have not eaten for a while (fasting). You may have this done every 1-3 years.  Bone density scan. This is done to screen for osteoporosis. You may have this done starting at age 55.  Mammogram. This may be done every 1-2 years. Talk to your health care provider about how often you should have regular mammograms. Talk with your health care provider about your test results,  treatment options, and if necessary, the need for more tests. Vaccines  Your health care provider may recommend certain vaccines, such as:  Influenza vaccine. This is recommended every year.  Tetanus, diphtheria, and  acellular pertussis (Tdap, Td) vaccine. You may need a Td booster every 10 years.  Zoster vaccine. You may need this after age 74.  Pneumococcal 13-valent conjugate (PCV13) vaccine. One dose is recommended after age 55.  Pneumococcal polysaccharide (PPSV23) vaccine. One dose is recommended after age 59. Talk to your health care provider about which screenings and vaccines you need and how often you need them. This information is not intended to replace advice given to you by your health care provider. Make sure you discuss any questions you have with your health care provider. Document Released: 12/06/2015 Document Revised: 07/29/2016 Document Reviewed: 09/10/2015 Elsevier Interactive Patient Education  2017 [employer] Prevention in the Home Falls can cause injuries. They can happen to people of all ages. There are many things you can do to make your home safe and to help prevent falls. What can I do on the outside of my home?  Regularly fix the edges of walkways and driveways and fix any cracks.  Remove anything that might make you trip as you walk through a door, such as a raised step or threshold.  Trim any bushes or trees on the path to your home.  Use bright outdoor lighting.  Clear any walking paths of anything that might make someone trip, such as rocks or tools.  Regularly check to see if handrails are loose or broken. Make sure that both sides of any steps have handrails.  Any raised decks and porches should have guardrails on the edges.  Have any leaves, snow, or ice cleared regularly.  Use sand or salt on walking paths during winter.  Clean up any spills in your garage right away. This includes oil or grease spills. What can I do in the bathroom?  Use night lights.  Install grab bars by the toilet and in the tub and shower. Do not use towel bars as grab bars.  Use non-skid mats or decals in the tub or shower.  If you need to sit down in the shower, use  a plastic, non-slip stool.  Keep the floor dry. Clean up any water that spills on the floor as soon as it happens.  Remove soap buildup in the tub or shower regularly.  Attach bath mats securely with double-sided non-slip rug tape.  Do not have throw rugs and other things on the floor that can make you trip. What can I do in the bedroom?  Use night lights.  Make sure that you have a light by your bed that is easy to reach.  Do not use any sheets or blankets that are too big for your bed. They should not hang down onto the floor.  Have a firm chair that has side arms. You can use this for support while you get dressed.  Do not have throw rugs and other things on the floor that can make you trip. What can I do in the kitchen?  Clean up any spills right away.  Avoid walking on wet floors.  Keep items that you use a lot in easy-to-reach places.  If you need to reach something above you, use a strong step stool that has a grab bar.  Keep electrical cords out of the way.  Do not use floor polish or wax that makes floors  slippery. If you must use wax, use non-skid floor wax.  Do not have throw rugs and other things on the floor that can make you trip. What can I do with my stairs?  Do not leave any items on the stairs.  Make sure that there are handrails on both sides of the stairs and use them. Fix handrails that are broken or loose. Make sure that handrails are as long as the stairways.  Check any carpeting to make sure that it is firmly attached to the stairs. Fix any carpet that is loose or worn.  Avoid having throw rugs at the top or bottom of the stairs. If you do have throw rugs, attach them to the floor with carpet tape.  Make sure that you have a light switch at the top of the stairs and the bottom of the stairs. If you do not have them, ask someone to add them for you. What else can I do to help prevent falls?  Wear shoes that:  Do not have high heels.  Have  rubber bottoms.  Are comfortable and fit you well.  Are closed at the toe. Do not wear sandals.  If you use a stepladder:  Make sure that it is fully opened. Do not climb a closed stepladder.  Make sure that both sides of the stepladder are locked into place.  Ask someone to hold it for you, if possible.  Clearly mark and make sure that you can see:  Any grab bars or handrails.  First and last steps.  Where the edge of each step is.  Use tools that help you move around (mobility aids) if they are needed. These include:  Canes.  Walkers.  Scooters.  Crutches.  Turn on the lights when you go into a dark area. Replace any light bulbs as soon as they burn out.  Set up your furniture so you have a clear path. Avoid moving your furniture around.  If any of your floors are uneven, fix them.  If there are any pets around you, be aware of where they are.  Review your medicines with your doctor. Some medicines can make you feel dizzy. This can increase your chance of falling. Ask your doctor what other things that you can do to help prevent falls. This information is not intended to replace advice given to you by your health care provider. Make sure you discuss any questions you have with your health care provider. Document Released: 09/05/2009 Document Revised: 04/16/2016 Document Reviewed: 12/14/2014 Elsevier Interactive Patient Education  2017 [employer].

## 2017-09-08 DIAGNOSIS — M9905 Segmental and somatic dysfunction of pelvic region: Secondary | ICD-10-CM | POA: Diagnosis not present

## 2017-09-08 DIAGNOSIS — M9903 Segmental and somatic dysfunction of lumbar region: Secondary | ICD-10-CM | POA: Diagnosis not present

## 2017-09-08 DIAGNOSIS — M5116 Intervertebral disc disorders with radiculopathy, lumbar region: Secondary | ICD-10-CM | POA: Diagnosis not present

## 2017-09-08 DIAGNOSIS — M9902 Segmental and somatic dysfunction of thoracic region: Secondary | ICD-10-CM | POA: Diagnosis not present

## 2017-09-08 DIAGNOSIS — M955 Acquired deformity of pelvis: Secondary | ICD-10-CM | POA: Diagnosis not present

## 2017-10-06 DIAGNOSIS — M955 Acquired deformity of pelvis: Secondary | ICD-10-CM | POA: Diagnosis not present

## 2017-10-06 DIAGNOSIS — M9905 Segmental and somatic dysfunction of pelvic region: Secondary | ICD-10-CM | POA: Diagnosis not present

## 2017-10-06 DIAGNOSIS — M9902 Segmental and somatic dysfunction of thoracic region: Secondary | ICD-10-CM | POA: Diagnosis not present

## 2017-10-06 DIAGNOSIS — M9903 Segmental and somatic dysfunction of lumbar region: Secondary | ICD-10-CM | POA: Diagnosis not present

## 2017-10-06 DIAGNOSIS — M5116 Intervertebral disc disorders with radiculopathy, lumbar region: Secondary | ICD-10-CM | POA: Diagnosis not present

## 2017-10-25 ENCOUNTER — Encounter: Payer: Self-pay | Admitting: Internal Medicine

## 2017-10-25 ENCOUNTER — Ambulatory Visit (INDEPENDENT_AMBULATORY_CARE_PROVIDER_SITE_OTHER): Payer: Medicare Other | Admitting: Internal Medicine

## 2017-10-25 VITALS — BP 118/64 | HR 81 | Ht 66.0 in | Wt 153.0 lb

## 2017-10-25 DIAGNOSIS — Z1231 Encounter for screening mammogram for malignant neoplasm of breast: Secondary | ICD-10-CM | POA: Diagnosis not present

## 2017-10-25 DIAGNOSIS — H3321 Serous retinal detachment, right eye: Secondary | ICD-10-CM

## 2017-10-25 DIAGNOSIS — R7303 Prediabetes: Secondary | ICD-10-CM | POA: Diagnosis not present

## 2017-10-25 DIAGNOSIS — E039 Hypothyroidism, unspecified: Secondary | ICD-10-CM | POA: Diagnosis not present

## 2017-10-25 DIAGNOSIS — I499 Cardiac arrhythmia, unspecified: Secondary | ICD-10-CM

## 2017-10-25 DIAGNOSIS — I491 Atrial premature depolarization: Secondary | ICD-10-CM | POA: Insufficient documentation

## 2017-10-25 DIAGNOSIS — Z0000 Encounter for general adult medical examination without abnormal findings: Secondary | ICD-10-CM | POA: Diagnosis not present

## 2017-10-25 DIAGNOSIS — E782 Mixed hyperlipidemia: Secondary | ICD-10-CM | POA: Diagnosis not present

## 2017-10-25 DIAGNOSIS — H3320 Serous retinal detachment, unspecified eye: Secondary | ICD-10-CM | POA: Insufficient documentation

## 2017-10-25 LAB — POCT URINALYSIS DIPSTICK
BILIRUBIN UA: NEGATIVE
GLUCOSE UA: NEGATIVE
KETONES UA: NEGATIVE
Leukocytes, UA: NEGATIVE
NITRITE UA: NEGATIVE
PH UA: 6 (ref 5.0–8.0)
RBC UA: NEGATIVE
Spec Grav, UA: 1.02 (ref 1.010–1.025)
Urobilinogen, UA: 0.2 E.U./dL

## 2017-10-25 NOTE — Progress Notes (Signed)
Date:  10/25/2017   Name:  Kelsey Pacheco   DOB:  03/23/1941   MRN:  [id number]   Chief Complaint: Annual Exam (Breast Exam. ); Hyperlipidemia; and Hypothyroidism [facility] 76 y.o. female who presents today for her Complete Annual Exam. She feels fairly well. She reports exercising walking. She reports she is sleeping well. She is due for a mammogram.  She denies breast problems.  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  Hyperlipidemia  This is a chronic problem. The problem is controlled. Pertinent negatives include no chest pain or shortness of breath. Current antihyperlipidemic treatment includes statins.  Thyroid Problem  Presents for follow-up visit. Patient reports no anxiety, constipation, depressed mood, diarrhea, fatigue, palpitations, tremors or weight gain. The symptoms have been stable. Her past medical history is significant for hyperlipidemia.  Pre-diabetes - doing well, no change in weight or diet.  No polyuria or non healing wounds.  Lab Results  Component Value Date   HGBA1C 6.2 (H) 10/23/2016   Lab Results  Component Value Date   CHOL 210 (H) 10/23/2016   HDL 85 10/23/2016   LDLCALC 105 (H) 10/23/2016   TRIG 100 10/23/2016   CHOLHDL 2.5 10/23/2016   Lab Results  Component Value Date   ALT 30 10/23/2016   AST 27 10/23/2016   ALKPHOS 101 10/23/2016   BILITOT 0.5 10/23/2016     Review of Systems  Constitutional: Negative for chills, fatigue, fever and weight gain.  HENT: Negative for congestion, hearing loss, tinnitus, trouble swallowing and voice change.   Eyes: Positive for visual disturbance (recent retinal detachment).  Respiratory: Negative for cough, chest tightness, shortness of breath and wheezing.   Cardiovascular: Negative for chest pain, palpitations and leg swelling.  Gastrointestinal: Negative for abdominal pain, constipation, diarrhea and  vomiting.  Endocrine: Negative for polydipsia and polyuria.  Genitourinary: Negative for dysuria, frequency, genital sores, vaginal bleeding and vaginal discharge.  Musculoskeletal: Positive for back pain (DDD). Negative for arthralgias, gait problem and joint swelling.  Skin: Negative for color change and rash.  Allergic/Immunologic: Negative for environmental allergies.  Neurological: Negative for dizziness, tremors, light-headedness and headaches.  Hematological: Negative for adenopathy. Does not bruise/bleed easily.  Psychiatric/Behavioral: Negative for dysphoric mood and sleep disturbance. The patient is not nervous/anxious.     Patient Active Problem List   Diagnosis Date Noted  . Left-sided low back pain with left-sided sciatica 04/01/2016  . Osteopenia 11/20/2015  . Allergic rhinitis 11/15/2015  . Body mass index (BMI) of 25.0-25.9 in adult 11/15/2015  . Prediabetes 11/15/2015  . Adult hypothyroidism 10/16/2015  . Hyperlipidemia 06/24/2015    Prior to Admission medications   Medication Sig Start Date End Date Taking? Authorizing Provider  Ascorbic Acid (VITAMIN C) 100 MG tablet Take 1 tablet by mouth daily.    [provider]  aspirin 81 MG tablet Take 1 tablet by mouth daily.    [provider]  atorvastatin (LIPITOR) 40 MG tablet Take 1 tablet (40 mg total) by mouth daily. 10/23/16   Kelsey Pacheco, Kelsey Pacheco H, MD  Calcium Carbonate (CALCIUM 600 PO) Take 1 tablet by mouth daily.    [provider]  levothyroxine (SYNTHROID, LEVOTHROID) 100 MCG tablet Take 1 tablet (100 mcg total) by mouth daily. 10/23/16   Kelsey Pacheco, Del Wiseman H, MD  MULTIPLE VITAMIN PO Take 1 tablet by mouth daily.    [provider]  Vitamin D, Cholecalciferol, 1000 UNITS TABS Take 1 tablet by mouth daily.    [provider]  Vitamin E 100 UNITS TABS Take 1 capsule by mouth daily.    [provider]    Allergies  Allergen Reactions  . Fosamax [Alendronate] Other  (See Comments)    Body aches    Past Surgical History:  Procedure Laterality Date  . BASAL CELL CARCINOMA EXCISION  06/04/11    [facility] (Mohs procedure) left orbital Dr. Cheryll Dessert  . BREAST BIOPSY Right 2006   bx/clip-neg  . Cologuard home test  12/2015   Negative  . RETINAL DETACHMENT SURGERY Right     Social History   Tobacco Use  . Smoking status: Never Smoker  . Smokeless tobacco: Never Used  Substance Use Topics  . Alcohol use: No  . Drug use: No     Medication list has been reviewed and updated.  PHQ 2/9 Scores 09/06/2017 10/23/2016 04/01/2016 11/20/2015  PHQ - 2 Score 0 0 0 0    Physical Exam  Constitutional: She is oriented to person, place, and time. She appears well-developed and well-nourished. No distress.  HENT:  Head: Normocephalic and atraumatic.  Right Ear: Tympanic membrane and ear canal normal.  Left Ear: Tympanic membrane and ear canal normal.  Nose: Right sinus exhibits no maxillary sinus tenderness. Left sinus exhibits no maxillary sinus tenderness.  Mouth/Throat: Uvula is midline and oropharynx is clear and moist.  Eyes: Conjunctivae and EOM are normal. Right eye exhibits no discharge. Left eye exhibits no discharge. No scleral icterus.  Neck: Normal range of motion. Carotid bruit is not present. No erythema present. No thyromegaly present.  Cardiovascular: Normal rate, regular rhythm, normal heart sounds, intact distal pulses and normal pulses. Frequent extrasystoles are present. Exam reveals no gallop.  No murmur heard. Pulmonary/Chest: Effort normal. No respiratory distress. She has no wheezes. Right breast exhibits no mass, no nipple discharge, no skin change and no tenderness. Left breast exhibits no mass, no nipple discharge, no skin change and no tenderness.  Abdominal: Soft. Bowel sounds are normal. There is no hepatosplenomegaly. There is no tenderness. There is no CVA tenderness.  Musculoskeletal: She exhibits no edema or tenderness.    Lymphadenopathy:    She has no cervical adenopathy.    She has no axillary adenopathy.  Neurological: She is alert and oriented to person, place, and time. She has normal strength and normal reflexes. No cranial nerve deficit or sensory deficit. Coordination and gait normal.  Skin: Skin is warm, dry and intact. No rash noted.  Psychiatric: She has a normal mood and affect. Her speech is normal and behavior is normal. Thought content normal.  Nursing note and vitals reviewed.   BP 118/64   Pulse 81   Ht 5\' 6"  (1.676 m)   Wt 153 lb (69.4 kg)   SpO2 99%   BMI 24.69 kg/m   Assessment and Plan: 1. Annual physical exam Continue healthy diet and exercise - POCT urinalysis dipstick  2. Encounter for screening mammogram for breast cancer Schedule at [facility] - MM DIGITAL SCREENING BILATERAL; Future  3. Adult hypothyroidism supplemented - TSH  4. Prediabetes - CBC with Differential/Platelet - Hemoglobin A1c  5. Mixed hyperlipidemia On statin therapy - Comprehensive metabolic panel - Lipid panel  6. Cardiac arrhythmia, unspecified cardiac arrhythmia type On ASA 81 mg daily - EKG 12-Lead - SR @ 76 with occasional PACs   No orders of the defined types were placed in this encounter.  Partially dictated using Animal nutritionistDragon software. Any errors are unintentional.  Bari EdwardLaura Zahirah Cheslock, MD [facility]  10/25/2017

## 2017-10-25 NOTE — Patient Instructions (Signed)

## 2017-10-26 LAB — LIPID PANEL
Chol/HDL Ratio: 2.2 ratio (ref 0.0–4.4)
Cholesterol, Total: 197 mg/dL (ref 100–199)
HDL: 88 mg/dL (ref >39)
LDL Calculated: 81 mg/dL (ref 0–99)
Triglycerides: 142 mg/dL (ref 0–149)
VLDL CHOLESTEROL CAL: 28 mg/dL (ref 5–40)

## 2017-10-26 LAB — CBC WITH DIFFERENTIAL/PLATELET
BASOS: 1 %
Basophils Absolute: 0 10*3/uL (ref 0.0–0.2)
EOS (ABSOLUTE): 0.1 10*3/uL (ref 0.0–0.4)
Eos: 2 %
HEMOGLOBIN: 13.8 g/dL (ref 11.1–15.9)
Hematocrit: 42.5 % (ref 34.0–46.6)
IMMATURE GRANS (ABS): 0 10*3/uL (ref 0.0–0.1)
Immature Granulocytes: 0 %
LYMPHS: 20 %
Lymphocytes Absolute: 1.1 10*3/uL (ref 0.7–3.1)
MCH: 30.2 pg (ref 26.6–33.0)
MCHC: 32.5 g/dL (ref 31.5–35.7)
MCV: 93 fL (ref 79–97)
MONOCYTES: 8 %
Monocytes Absolute: 0.5 10*3/uL (ref 0.1–0.9)
NEUTROS ABS: 3.8 10*3/uL (ref 1.4–7.0)
Neutrophils: 69 %
Platelets: 247 10*3/uL (ref 150–379)
RBC: 4.57 x10E6/uL (ref 3.77–5.28)
RDW: 13 % (ref 12.3–15.4)
WBC: 5.5 10*3/uL (ref 3.4–10.8)

## 2017-10-26 LAB — COMPREHENSIVE METABOLIC PANEL
A/G RATIO: 2.4 — AB (ref 1.2–2.2)
ALBUMIN: 5 g/dL — AB (ref 3.5–4.8)
ALT: 28 IU/L (ref 0–32)
AST: 26 IU/L (ref 0–40)
Alkaline Phosphatase: 102 IU/L (ref 39–117)
BILIRUBIN TOTAL: 0.5 mg/dL (ref 0.0–1.2)
BUN / CREAT RATIO: 16 (ref 12–28)
BUN: 14 mg/dL (ref 8–27)
CHLORIDE: 101 mmol/L (ref 96–106)
CO2: 24 mmol/L (ref 20–29)
Calcium: 9.8 mg/dL (ref 8.7–10.3)
Creatinine, Ser: 0.87 mg/dL (ref 0.57–1.00)
GFR calc non Af Amer: 65 mL/min/{1.73_m2} (ref >59)
GFR, EST AFRICAN AMERICAN: 75 mL/min/{1.73_m2} (ref >59)
GLOBULIN, TOTAL: 2.1 g/dL (ref 1.5–4.5)
Glucose: 116 mg/dL — ABNORMAL HIGH (ref 65–99)
POTASSIUM: 4.4 mmol/L (ref 3.5–5.2)
SODIUM: 142 mmol/L (ref 134–144)
TOTAL PROTEIN: 7.1 g/dL (ref 6.0–8.5)

## 2017-10-26 LAB — HEMOGLOBIN A1C
ESTIMATED AVERAGE GLUCOSE: 131 mg/dL
HEMOGLOBIN A1C: 6.2 % — AB (ref 4.8–5.6)

## 2017-10-26 LAB — TSH: TSH: 3.26 u[IU]/mL (ref 0.450–4.500)

## 2017-10-27 DIAGNOSIS — M955 Acquired deformity of pelvis: Secondary | ICD-10-CM | POA: Diagnosis not present

## 2017-10-27 DIAGNOSIS — M5116 Intervertebral disc disorders with radiculopathy, lumbar region: Secondary | ICD-10-CM | POA: Diagnosis not present

## 2017-10-27 DIAGNOSIS — M9902 Segmental and somatic dysfunction of thoracic region: Secondary | ICD-10-CM | POA: Diagnosis not present

## 2017-10-27 DIAGNOSIS — M9905 Segmental and somatic dysfunction of pelvic region: Secondary | ICD-10-CM | POA: Diagnosis not present

## 2017-10-27 DIAGNOSIS — M9903 Segmental and somatic dysfunction of lumbar region: Secondary | ICD-10-CM | POA: Diagnosis not present

## 2017-12-01 DIAGNOSIS — M9905 Segmental and somatic dysfunction of pelvic region: Secondary | ICD-10-CM | POA: Diagnosis not present

## 2017-12-01 DIAGNOSIS — M955 Acquired deformity of pelvis: Secondary | ICD-10-CM | POA: Diagnosis not present

## 2017-12-01 DIAGNOSIS — M9902 Segmental and somatic dysfunction of thoracic region: Secondary | ICD-10-CM | POA: Diagnosis not present

## 2017-12-01 DIAGNOSIS — M9903 Segmental and somatic dysfunction of lumbar region: Secondary | ICD-10-CM | POA: Diagnosis not present

## 2017-12-01 DIAGNOSIS — M5442 Lumbago with sciatica, left side: Secondary | ICD-10-CM | POA: Diagnosis not present

## 2017-12-06 DIAGNOSIS — H33001 Unspecified retinal detachment with retinal break, right eye: Secondary | ICD-10-CM | POA: Diagnosis not present

## 2017-12-21 DIAGNOSIS — M9902 Segmental and somatic dysfunction of thoracic region: Secondary | ICD-10-CM | POA: Diagnosis not present

## 2017-12-21 DIAGNOSIS — M955 Acquired deformity of pelvis: Secondary | ICD-10-CM | POA: Diagnosis not present

## 2017-12-21 DIAGNOSIS — M5442 Lumbago with sciatica, left side: Secondary | ICD-10-CM | POA: Diagnosis not present

## 2017-12-21 DIAGNOSIS — M9903 Segmental and somatic dysfunction of lumbar region: Secondary | ICD-10-CM | POA: Diagnosis not present

## 2017-12-21 DIAGNOSIS — M9905 Segmental and somatic dysfunction of pelvic region: Secondary | ICD-10-CM | POA: Diagnosis not present

## 2018-01-04 DIAGNOSIS — H2513 Age-related nuclear cataract, bilateral: Secondary | ICD-10-CM | POA: Diagnosis not present

## 2018-01-05 ENCOUNTER — Ambulatory Visit
Admission: RE | Admit: 2018-01-05 | Discharge: 2018-01-05 | Disposition: A | Discharge status: Home / Self Care | Payer: Medicare Other | Source: Ambulatory Visit | Attending: Internal Medicine | Admitting: Internal Medicine

## 2018-01-05 DIAGNOSIS — Z1231 Encounter for screening mammogram for malignant neoplasm of breast: Secondary | ICD-10-CM | POA: Insufficient documentation

## 2018-01-14 DIAGNOSIS — M5442 Lumbago with sciatica, left side: Secondary | ICD-10-CM | POA: Diagnosis not present

## 2018-01-14 DIAGNOSIS — M9903 Segmental and somatic dysfunction of lumbar region: Secondary | ICD-10-CM | POA: Diagnosis not present

## 2018-01-14 DIAGNOSIS — M9905 Segmental and somatic dysfunction of pelvic region: Secondary | ICD-10-CM | POA: Diagnosis not present

## 2018-01-14 DIAGNOSIS — M9902 Segmental and somatic dysfunction of thoracic region: Secondary | ICD-10-CM | POA: Diagnosis not present

## 2018-01-14 DIAGNOSIS — M955 Acquired deformity of pelvis: Secondary | ICD-10-CM | POA: Diagnosis not present

## 2018-01-26 DIAGNOSIS — M9905 Segmental and somatic dysfunction of pelvic region: Secondary | ICD-10-CM | POA: Diagnosis not present

## 2018-01-26 DIAGNOSIS — M9902 Segmental and somatic dysfunction of thoracic region: Secondary | ICD-10-CM | POA: Diagnosis not present

## 2018-01-26 DIAGNOSIS — M5442 Lumbago with sciatica, left side: Secondary | ICD-10-CM | POA: Diagnosis not present

## 2018-01-26 DIAGNOSIS — M9903 Segmental and somatic dysfunction of lumbar region: Secondary | ICD-10-CM | POA: Diagnosis not present

## 2018-01-26 DIAGNOSIS — M955 Acquired deformity of pelvis: Secondary | ICD-10-CM | POA: Diagnosis not present

## 2018-02-09 DIAGNOSIS — M5442 Lumbago with sciatica, left side: Secondary | ICD-10-CM | POA: Diagnosis not present

## 2018-02-09 DIAGNOSIS — M9903 Segmental and somatic dysfunction of lumbar region: Secondary | ICD-10-CM | POA: Diagnosis not present

## 2018-02-09 DIAGNOSIS — M9902 Segmental and somatic dysfunction of thoracic region: Secondary | ICD-10-CM | POA: Diagnosis not present

## 2018-02-09 DIAGNOSIS — M9905 Segmental and somatic dysfunction of pelvic region: Secondary | ICD-10-CM | POA: Diagnosis not present

## 2018-02-09 DIAGNOSIS — M955 Acquired deformity of pelvis: Secondary | ICD-10-CM | POA: Diagnosis not present

## 2018-02-20 ENCOUNTER — Other Ambulatory Visit: Payer: Self-pay | Admitting: Internal Medicine

## 2018-03-01 DIAGNOSIS — H2511 Age-related nuclear cataract, right eye: Secondary | ICD-10-CM | POA: Diagnosis not present

## 2018-03-02 ENCOUNTER — Telehealth: Payer: Self-pay | Admitting: Internal Medicine

## 2018-03-02 DIAGNOSIS — M9903 Segmental and somatic dysfunction of lumbar region: Secondary | ICD-10-CM | POA: Diagnosis not present

## 2018-03-02 DIAGNOSIS — M955 Acquired deformity of pelvis: Secondary | ICD-10-CM | POA: Diagnosis not present

## 2018-03-02 DIAGNOSIS — M9902 Segmental and somatic dysfunction of thoracic region: Secondary | ICD-10-CM | POA: Diagnosis not present

## 2018-03-02 DIAGNOSIS — M5442 Lumbago with sciatica, left side: Secondary | ICD-10-CM | POA: Diagnosis not present

## 2018-03-02 DIAGNOSIS — M9905 Segmental and somatic dysfunction of pelvic region: Secondary | ICD-10-CM | POA: Diagnosis not present

## 2018-03-02 NOTE — Telephone Encounter (Signed)
Called to RE-schedule AWV with Nurse Health Advisor. °Thank you! °For questions please contact: °Kathryn Brown °[phone]  °Skype [email or web address]  ° °

## 2018-03-04 NOTE — Telephone Encounter (Signed)
Called to reschedule 09/08/18 AWV. Program no longer available on Thursdays. LVM requesting returned call.

## 2018-03-06 ENCOUNTER — Other Ambulatory Visit: Payer: Self-pay | Admitting: Internal Medicine

## 2018-03-08 ENCOUNTER — Encounter: Payer: Self-pay | Admitting: *Deleted

## 2018-03-08 ENCOUNTER — Other Ambulatory Visit: Payer: Self-pay

## 2018-03-14 MED ORDER — PROPOFOL 10 MG/ML IV BOLUS
INTRAVENOUS | Status: AC
  Filled 2018-03-14: qty 20

## 2018-03-14 NOTE — Discharge Instructions (Signed)

## 2018-03-16 ENCOUNTER — Ambulatory Visit: Payer: Medicare Other | Admitting: Anesthesiology

## 2018-03-16 ENCOUNTER — Ambulatory Visit
Admission: RE | Admit: 2018-03-16 | Discharge: 2018-03-16 | Disposition: A | Discharge status: Home / Self Care | Payer: Medicare Other | Source: Ambulatory Visit | Attending: Ophthalmology | Admitting: Ophthalmology

## 2018-03-16 ENCOUNTER — Encounter
Admission: RE | Disposition: A | Discharge status: Home / Self Care | Payer: Self-pay | Source: Ambulatory Visit | Attending: Ophthalmology

## 2018-03-16 DIAGNOSIS — Z7982 Long term (current) use of aspirin: Secondary | ICD-10-CM | POA: Diagnosis not present

## 2018-03-16 DIAGNOSIS — H2511 Age-related nuclear cataract, right eye: Secondary | ICD-10-CM | POA: Insufficient documentation

## 2018-03-16 DIAGNOSIS — E039 Hypothyroidism, unspecified: Secondary | ICD-10-CM | POA: Diagnosis not present

## 2018-03-16 DIAGNOSIS — E7800 Pure hypercholesterolemia, unspecified: Secondary | ICD-10-CM | POA: Insufficient documentation

## 2018-03-16 DIAGNOSIS — Z79899 Other long term (current) drug therapy: Secondary | ICD-10-CM | POA: Diagnosis not present

## 2018-03-16 DIAGNOSIS — H25811 Combined forms of age-related cataract, right eye: Secondary | ICD-10-CM | POA: Diagnosis not present

## 2018-03-16 DIAGNOSIS — Z79890 Hormone replacement therapy (postmenopausal): Secondary | ICD-10-CM | POA: Insufficient documentation

## 2018-03-16 HISTORY — DX: Unspecified osteoarthritis, unspecified site: M19.90

## 2018-03-16 HISTORY — PX: CATARACT EXTRACTION W/PHACO: SHX586

## 2018-03-16 HISTORY — DX: Sciatica, left side: M54.32

## 2018-03-16 HISTORY — DX: Hypothyroidism, unspecified: E03.9

## 2018-03-16 SURGERY — PHACOEMULSIFICATION, CATARACT, WITH IOL INSERTION
Anesthesia: Monitor Anesthesia Care | Site: Eye | Laterality: Right | Wound class: "Clean "

## 2018-03-16 MED ORDER — LIDOCAINE HCL (PF) 2 % IJ SOLN
INTRAOCULAR | Status: DC | PRN
  Administered 2018-03-16: .5 mL

## 2018-03-16 MED ORDER — MIDAZOLAM HCL 2 MG/2ML IJ SOLN
INTRAMUSCULAR | Status: DC | PRN
  Administered 2018-03-16 (×2): 1 mg via INTRAVENOUS

## 2018-03-16 MED ORDER — BRIMONIDINE TARTRATE-TIMOLOL 0.2-0.5 % OP SOLN
OPHTHALMIC | Status: DC | PRN
  Administered 2018-03-16: 1 [drp] via OPHTHALMIC

## 2018-03-16 MED ORDER — EPINEPHRINE PF 1 MG/ML IJ SOLN
INTRAOCULAR | Status: DC | PRN
  Administered 2018-03-16: 86 mL via OPHTHALMIC

## 2018-03-16 MED ORDER — ACETAMINOPHEN 160 MG/5ML PO SOLN: 325.0000 mg | Freq: Once | ORAL | Status: DC

## 2018-03-16 MED ORDER — CEFUROXIME OPHTHALMIC INJECTION 1 MG/0.1 ML
INJECTION | OPHTHALMIC | Status: DC | PRN
  Administered 2018-03-16: 0.1 mL via OPHTHALMIC

## 2018-03-16 MED ORDER — ACETAMINOPHEN 325 MG PO TABS: 325.0000 mg | ORAL_TABLET | Freq: Once | ORAL | Status: DC

## 2018-03-16 MED ORDER — ARMC OPHTHALMIC DILATING DROPS
1.0000 "application " | OPHTHALMIC | Status: DC | PRN
  Administered 2018-03-16 (×3): 1 via OPHTHALMIC

## 2018-03-16 MED ORDER — NA HYALUR & NA CHOND-NA HYALUR 0.4-0.35 ML IO KIT
PACK | INTRAOCULAR | Status: DC | PRN
  Administered 2018-03-16: 1 mL via INTRAOCULAR

## 2018-03-16 MED ORDER — FENTANYL CITRATE (PF) 100 MCG/2ML IJ SOLN
INTRAMUSCULAR | Status: DC | PRN
  Administered 2018-03-16 (×2): 50 ug via INTRAVENOUS

## 2018-03-16 MED ORDER — MOXIFLOXACIN HCL 0.5 % OP SOLN
1.0000 [drp] | OPHTHALMIC | Status: DC | PRN
  Administered 2018-03-16 (×3): 1 [drp] via OPHTHALMIC

## 2018-03-16 MED ORDER — LACTATED RINGERS IV SOLN: INTRAVENOUS | Status: DC

## 2018-03-16 SURGICAL SUPPLY — 27 items
CANNULA ANT/CHMB 27G (MISCELLANEOUS) ×1 IMPLANT
CANNULA ANT/CHMB 27GA (MISCELLANEOUS) ×3 IMPLANT
CARTRIDGE ABBOTT (MISCELLANEOUS) IMPLANT
GLOVE SURG LX 7.5 STRW (GLOVE) ×2
GLOVE SURG LX STRL 7.5 STRW (GLOVE) ×1 IMPLANT
GLOVE SURG TRIUMPH 8.0 PF LTX (GLOVE) ×3 IMPLANT
GOWN STRL REUS W/ TWL LRG LVL3 (GOWN DISPOSABLE) ×2 IMPLANT
GOWN STRL REUS W/TWL LRG LVL3 (GOWN DISPOSABLE) ×6
LENS IOL TECNIS ITEC 19.0 (Intraocular Lens) ×2 IMPLANT
MARKER SKIN DUAL TIP RULER LAB (MISCELLANEOUS) ×3 IMPLANT
NDL FILTER BLUNT 18X1 1/2 (NEEDLE) ×1 IMPLANT
NDL RETROBULBAR .5 NSTRL (NEEDLE) IMPLANT
NEEDLE FILTER BLUNT 18X 1/2SAF (NEEDLE) ×2
NEEDLE FILTER BLUNT 18X1 1/2 (NEEDLE) ×1 IMPLANT
PACK CATARACT BRASINGTON (MISCELLANEOUS) ×3 IMPLANT
PACK EYE AFTER SURG (MISCELLANEOUS) ×3 IMPLANT
PACK OPTHALMIC (MISCELLANEOUS) ×3 IMPLANT
RING MALYGIN 7.0 (MISCELLANEOUS) IMPLANT
SUT ETHILON 10-0 CS-B-6CS-B-6 (SUTURE)
SUT VICRYL  9 0 (SUTURE)
SUT VICRYL 9 0 (SUTURE) IMPLANT
SUTURE EHLN 10-0 CS-B-6CS-B-6 (SUTURE) IMPLANT
SYR 3ML LL SCALE MARK (SYRINGE) ×3 IMPLANT
SYR 5ML LL (SYRINGE) ×3 IMPLANT
SYR TB 1ML LUER SLIP (SYRINGE) ×3 IMPLANT
WATER STERILE IRR 500ML POUR (IV SOLUTION) ×3 IMPLANT
WIPE NON LINTING 3.25X3.25 (MISCELLANEOUS) ×3 IMPLANT

## 2018-03-16 NOTE — H&P (Signed)
The History and Physical notes are on paper, have been signed, and are to be scanned. The patient remains stable and unchanged from the H&P.   Previous H&P reviewed, patient examined, and there are no changes.  Kelsey Pacheco 03/16/2018 7:40 AM

## 2018-03-16 NOTE — Anesthesia Postprocedure Evaluation (Signed)
Anesthesia Post Note  Patient: [facility]  Procedure(s) Performed: CATARACT EXTRACTION PHACO AND INTRAOCULAR LENS PLACEMENT ([place]) RIGHT (Right Eye)  Patient location during evaluation: PACU Anesthesia Type: MAC Level of consciousness: awake and alert and oriented Pain management: satisfactory to patient Vital Signs Assessment: post-procedure vital signs reviewed and stable Respiratory status: spontaneous breathing, nonlabored ventilation and respiratory function stable Cardiovascular status: blood pressure returned to baseline and stable Postop Assessment: Adequate PO intake and No signs of nausea or vomiting Anesthetic complications: no    Raliegh Ip

## 2018-03-16 NOTE — Transfer of Care (Signed)
Immediate Anesthesia Transfer of Care Note  Patient: [facility]  Procedure(s) Performed: CATARACT EXTRACTION PHACO AND INTRAOCULAR LENS PLACEMENT (I[facility]) RIGHT (Right Eye)  Patient Location: PACU  Anesthesia Type: MAC  Level of Consciousness: awake, alert  and patient cooperative  Airway and Oxygen Therapy: Patient Spontanous Breathing and Patient connected to supplemental oxygen  Post-op Assessment: Post-op Vital signs reviewed, Patient's Cardiovascular Status Stable, Respiratory Function Stable, Patent Airway and No signs of Nausea or vomiting  Post-op Vital Signs: Reviewed and stable  Complications: No apparent anesthesia complications

## 2018-03-16 NOTE — Op Note (Signed)
LOCATION:  [facility]   PREOPERATIVE DIAGNOSIS:    Nuclear sclerotic cataract right eye. H25.11   POSTOPERATIVE DIAGNOSIS:  Nuclear sclerotic cataract right eye.     PROCEDURE:  Phacoemusification with posterior chamber intraocular lens placement of the right eye   LENS:   Implant Name Type Inv. Item Serial No. Manufacturer Lot No. LRB No. Used  LENS IOL DIOP 19.0 - [id number] Intraocular Lens LENS IOL DIOP 19.0 [id number]  Right 1  LENS IOL DIOP 19.0 - [id number] Intraocular Lens LENS IOL DIOP 19.0 [id number] AMO  Right 1     1st lens discarded - did not inject.  2nd lens used   ULTRASOUND TIME: 19 % of 1 minutes, 50 seconds.  CDE 21.6   SURGEON:  Deirdre Evenerhadwick R. Caydn Justen, MD   ANESTHESIA:  Topical with tetracaine drops and 2% Xylocaine jelly, augmented with 1% preservative-free intracameral lidocaine.    COMPLICATIONS:  None.   DESCRIPTION OF PROCEDURE:  The patient was identified in the holding room and transported to the operating room and placed in the supine position under the operating microscope.  The right eye was identified as the operative eye and it was prepped and draped in the usual sterile ophthalmic fashion.   A 1 millimeter clear-corneal paracentesis was made at the 12:00 position.  0.5 ml of preservative-free 1% lidocaine was injected into the anterior chamber. The anterior chamber was filled with Viscoat viscoelastic.  A 2.4 millimeter keratome was used to make a near-clear corneal incision at the 9:00 position.  A curvilinear capsulorrhexis was made with a cystotome and capsulorrhexis forceps.  Balanced salt solution was used to hydrodissect and hydrodelineate the nucleus.   Phacoemulsification was then used in stop and chop fashion to remove the lens nucleus and epinucleus.  The remaining cortex was then removed using the irrigation and aspiration handpiece. Provisc was then placed into the capsular bag to distend it for lens placement.  A lens was then  injected into the capsular bag.  The remaining viscoelastic was aspirated.   Wounds were hydrated with balanced salt solution.  The anterior chamber was inflated to a physiologic pressure with balanced salt solution.  No wound leaks were noted. Cefuroxime 0.1 ml of a 10mg /ml solution was injected into the anterior chamber for a dose of 1 mg of intracameral antibiotic at the completion of the case.   Timolol and Brimonidine drops were applied to the eye.  The patient was taken to the recovery room in stable condition without complications of anesthesia or surgery.   Glennis Borger 03/16/2018, 8:31 AM

## 2018-03-16 NOTE — Anesthesia Procedure Notes (Signed)
Procedure Name: MAC Performed by: Efrata Brunner, CRNA Pre-anesthesia Checklist: Emergency Drugs available, Patient identified, Suction available, Patient being monitored and Timeout performed Patient Re-evaluated:Patient Re-evaluated prior to induction Oxygen Delivery Method: Nasal cannula       

## 2018-03-16 NOTE — Anesthesia Preprocedure Evaluation (Signed)
Anesthesia Evaluation  Patient identified by MRN, date of birth, ID band Patient awake    Reviewed: Allergy & Precautions, H&P , NPO status , Patient's Chart, lab work & pertinent test results  Airway Mallampati: II  TM Distance: >3 FB Neck ROM: full    Dental no notable dental hx.    Pulmonary    Pulmonary exam normal breath sounds clear to auscultation       Cardiovascular Normal cardiovascular exam Rhythm:regular Rate:Normal     Neuro/Psych    GI/Hepatic   Endo/Other  Hypothyroidism   Renal/GU      Musculoskeletal   Abdominal   Peds  Hematology   Anesthesia Other Findings   Reproductive/Obstetrics                             Anesthesia Physical Anesthesia Plan  ASA: II  Anesthesia Plan: MAC   Post-op Pain Management:    Induction:   PONV Risk Score and Plan: 2 and Treatment may vary due to age or medical condition  Airway Management Planned:   Additional Equipment:   Intra-op Plan:   Post-operative Plan:   Informed Consent: I have reviewed the patients History and Physical, chart, labs and discussed the procedure including the risks, benefits and alternatives for the proposed anesthesia with the patient or authorized representative who has indicated his/her understanding and acceptance.     Plan Discussed with: CRNA  Anesthesia Plan Comments:         Anesthesia Quick Evaluation

## 2018-03-17 ENCOUNTER — Encounter: Payer: Self-pay | Admitting: Ophthalmology

## 2018-03-23 DIAGNOSIS — M9903 Segmental and somatic dysfunction of lumbar region: Secondary | ICD-10-CM | POA: Diagnosis not present

## 2018-03-23 DIAGNOSIS — M9905 Segmental and somatic dysfunction of pelvic region: Secondary | ICD-10-CM | POA: Diagnosis not present

## 2018-03-23 DIAGNOSIS — M5442 Lumbago with sciatica, left side: Secondary | ICD-10-CM | POA: Diagnosis not present

## 2018-03-23 DIAGNOSIS — M9902 Segmental and somatic dysfunction of thoracic region: Secondary | ICD-10-CM | POA: Diagnosis not present

## 2018-03-23 DIAGNOSIS — M955 Acquired deformity of pelvis: Secondary | ICD-10-CM | POA: Diagnosis not present

## 2018-03-30 ENCOUNTER — Other Ambulatory Visit: Payer: Self-pay | Admitting: Internal Medicine

## 2018-03-30 DIAGNOSIS — K869 Disease of pancreas, unspecified: Secondary | ICD-10-CM

## 2018-04-13 DIAGNOSIS — M955 Acquired deformity of pelvis: Secondary | ICD-10-CM | POA: Diagnosis not present

## 2018-04-13 DIAGNOSIS — M9902 Segmental and somatic dysfunction of thoracic region: Secondary | ICD-10-CM | POA: Diagnosis not present

## 2018-04-13 DIAGNOSIS — M9903 Segmental and somatic dysfunction of lumbar region: Secondary | ICD-10-CM | POA: Diagnosis not present

## 2018-04-13 DIAGNOSIS — M5442 Lumbago with sciatica, left side: Secondary | ICD-10-CM | POA: Diagnosis not present

## 2018-04-13 DIAGNOSIS — M9905 Segmental and somatic dysfunction of pelvic region: Secondary | ICD-10-CM | POA: Diagnosis not present

## 2018-04-26 DIAGNOSIS — M955 Acquired deformity of pelvis: Secondary | ICD-10-CM | POA: Diagnosis not present

## 2018-04-26 DIAGNOSIS — M9902 Segmental and somatic dysfunction of thoracic region: Secondary | ICD-10-CM | POA: Diagnosis not present

## 2018-04-26 DIAGNOSIS — M9903 Segmental and somatic dysfunction of lumbar region: Secondary | ICD-10-CM | POA: Diagnosis not present

## 2018-04-26 DIAGNOSIS — M5442 Lumbago with sciatica, left side: Secondary | ICD-10-CM | POA: Diagnosis not present

## 2018-04-26 DIAGNOSIS — M9905 Segmental and somatic dysfunction of pelvic region: Secondary | ICD-10-CM | POA: Diagnosis not present

## 2018-05-18 DIAGNOSIS — M9905 Segmental and somatic dysfunction of pelvic region: Secondary | ICD-10-CM | POA: Diagnosis not present

## 2018-05-18 DIAGNOSIS — M955 Acquired deformity of pelvis: Secondary | ICD-10-CM | POA: Diagnosis not present

## 2018-05-18 DIAGNOSIS — M9903 Segmental and somatic dysfunction of lumbar region: Secondary | ICD-10-CM | POA: Diagnosis not present

## 2018-05-18 DIAGNOSIS — M5442 Lumbago with sciatica, left side: Secondary | ICD-10-CM | POA: Diagnosis not present

## 2018-05-18 DIAGNOSIS — M9902 Segmental and somatic dysfunction of thoracic region: Secondary | ICD-10-CM | POA: Diagnosis not present

## 2018-06-01 DIAGNOSIS — M9902 Segmental and somatic dysfunction of thoracic region: Secondary | ICD-10-CM | POA: Diagnosis not present

## 2018-06-01 DIAGNOSIS — M9903 Segmental and somatic dysfunction of lumbar region: Secondary | ICD-10-CM | POA: Diagnosis not present

## 2018-06-01 DIAGNOSIS — M5442 Lumbago with sciatica, left side: Secondary | ICD-10-CM | POA: Diagnosis not present

## 2018-06-01 DIAGNOSIS — M955 Acquired deformity of pelvis: Secondary | ICD-10-CM | POA: Diagnosis not present

## 2018-06-01 DIAGNOSIS — M9905 Segmental and somatic dysfunction of pelvic region: Secondary | ICD-10-CM | POA: Diagnosis not present

## 2018-06-15 DIAGNOSIS — M955 Acquired deformity of pelvis: Secondary | ICD-10-CM | POA: Diagnosis not present

## 2018-06-15 DIAGNOSIS — M5442 Lumbago with sciatica, left side: Secondary | ICD-10-CM | POA: Diagnosis not present

## 2018-06-15 DIAGNOSIS — M9905 Segmental and somatic dysfunction of pelvic region: Secondary | ICD-10-CM | POA: Diagnosis not present

## 2018-06-15 DIAGNOSIS — M9902 Segmental and somatic dysfunction of thoracic region: Secondary | ICD-10-CM | POA: Diagnosis not present

## 2018-06-15 DIAGNOSIS — M9903 Segmental and somatic dysfunction of lumbar region: Secondary | ICD-10-CM | POA: Diagnosis not present

## 2018-06-28 DIAGNOSIS — M9902 Segmental and somatic dysfunction of thoracic region: Secondary | ICD-10-CM | POA: Diagnosis not present

## 2018-06-28 DIAGNOSIS — M955 Acquired deformity of pelvis: Secondary | ICD-10-CM | POA: Diagnosis not present

## 2018-06-28 DIAGNOSIS — M5442 Lumbago with sciatica, left side: Secondary | ICD-10-CM | POA: Diagnosis not present

## 2018-06-28 DIAGNOSIS — M9905 Segmental and somatic dysfunction of pelvic region: Secondary | ICD-10-CM | POA: Diagnosis not present

## 2018-06-28 DIAGNOSIS — M9903 Segmental and somatic dysfunction of lumbar region: Secondary | ICD-10-CM | POA: Diagnosis not present

## 2018-07-12 DIAGNOSIS — M5442 Lumbago with sciatica, left side: Secondary | ICD-10-CM | POA: Diagnosis not present

## 2018-07-12 DIAGNOSIS — M9905 Segmental and somatic dysfunction of pelvic region: Secondary | ICD-10-CM | POA: Diagnosis not present

## 2018-07-12 DIAGNOSIS — M955 Acquired deformity of pelvis: Secondary | ICD-10-CM | POA: Diagnosis not present

## 2018-07-12 DIAGNOSIS — M9902 Segmental and somatic dysfunction of thoracic region: Secondary | ICD-10-CM | POA: Diagnosis not present

## 2018-07-12 DIAGNOSIS — M9903 Segmental and somatic dysfunction of lumbar region: Secondary | ICD-10-CM | POA: Diagnosis not present

## 2018-07-27 DIAGNOSIS — M955 Acquired deformity of pelvis: Secondary | ICD-10-CM | POA: Diagnosis not present

## 2018-07-27 DIAGNOSIS — M9902 Segmental and somatic dysfunction of thoracic region: Secondary | ICD-10-CM | POA: Diagnosis not present

## 2018-07-27 DIAGNOSIS — M9903 Segmental and somatic dysfunction of lumbar region: Secondary | ICD-10-CM | POA: Diagnosis not present

## 2018-07-27 DIAGNOSIS — M5442 Lumbago with sciatica, left side: Secondary | ICD-10-CM | POA: Diagnosis not present

## 2018-07-27 DIAGNOSIS — M9905 Segmental and somatic dysfunction of pelvic region: Secondary | ICD-10-CM | POA: Diagnosis not present

## 2018-08-10 DIAGNOSIS — M5442 Lumbago with sciatica, left side: Secondary | ICD-10-CM | POA: Diagnosis not present

## 2018-08-10 DIAGNOSIS — M9905 Segmental and somatic dysfunction of pelvic region: Secondary | ICD-10-CM | POA: Diagnosis not present

## 2018-08-10 DIAGNOSIS — M9903 Segmental and somatic dysfunction of lumbar region: Secondary | ICD-10-CM | POA: Diagnosis not present

## 2018-08-10 DIAGNOSIS — M955 Acquired deformity of pelvis: Secondary | ICD-10-CM | POA: Diagnosis not present

## 2018-08-10 DIAGNOSIS — M9902 Segmental and somatic dysfunction of thoracic region: Secondary | ICD-10-CM | POA: Diagnosis not present

## 2018-08-24 DIAGNOSIS — M9903 Segmental and somatic dysfunction of lumbar region: Secondary | ICD-10-CM | POA: Diagnosis not present

## 2018-08-24 DIAGNOSIS — M5442 Lumbago with sciatica, left side: Secondary | ICD-10-CM | POA: Diagnosis not present

## 2018-08-24 DIAGNOSIS — M9905 Segmental and somatic dysfunction of pelvic region: Secondary | ICD-10-CM | POA: Diagnosis not present

## 2018-08-24 DIAGNOSIS — M9902 Segmental and somatic dysfunction of thoracic region: Secondary | ICD-10-CM | POA: Diagnosis not present

## 2018-08-24 DIAGNOSIS — M955 Acquired deformity of pelvis: Secondary | ICD-10-CM | POA: Diagnosis not present

## 2018-08-25 ENCOUNTER — Telehealth: Payer: Self-pay | Admitting: Internal Medicine

## 2018-08-25 NOTE — Telephone Encounter (Signed)
resched AWV °

## 2018-09-07 ENCOUNTER — Ambulatory Visit: Payer: Medicare Other

## 2018-09-08 ENCOUNTER — Ambulatory Visit: Payer: Medicare Other

## 2018-09-13 DIAGNOSIS — M9905 Segmental and somatic dysfunction of pelvic region: Secondary | ICD-10-CM | POA: Diagnosis not present

## 2018-09-13 DIAGNOSIS — M955 Acquired deformity of pelvis: Secondary | ICD-10-CM | POA: Diagnosis not present

## 2018-09-13 DIAGNOSIS — M9903 Segmental and somatic dysfunction of lumbar region: Secondary | ICD-10-CM | POA: Diagnosis not present

## 2018-09-13 DIAGNOSIS — M5442 Lumbago with sciatica, left side: Secondary | ICD-10-CM | POA: Diagnosis not present

## 2018-09-13 DIAGNOSIS — M9902 Segmental and somatic dysfunction of thoracic region: Secondary | ICD-10-CM | POA: Diagnosis not present

## 2018-09-28 DIAGNOSIS — M955 Acquired deformity of pelvis: Secondary | ICD-10-CM | POA: Diagnosis not present

## 2018-09-28 DIAGNOSIS — M9905 Segmental and somatic dysfunction of pelvic region: Secondary | ICD-10-CM | POA: Diagnosis not present

## 2018-09-28 DIAGNOSIS — M5442 Lumbago with sciatica, left side: Secondary | ICD-10-CM | POA: Diagnosis not present

## 2018-09-28 DIAGNOSIS — M9903 Segmental and somatic dysfunction of lumbar region: Secondary | ICD-10-CM | POA: Diagnosis not present

## 2018-09-28 DIAGNOSIS — M9902 Segmental and somatic dysfunction of thoracic region: Secondary | ICD-10-CM | POA: Diagnosis not present

## 2018-10-12 DIAGNOSIS — M5442 Lumbago with sciatica, left side: Secondary | ICD-10-CM | POA: Diagnosis not present

## 2018-10-12 DIAGNOSIS — M955 Acquired deformity of pelvis: Secondary | ICD-10-CM | POA: Diagnosis not present

## 2018-10-12 DIAGNOSIS — M9905 Segmental and somatic dysfunction of pelvic region: Secondary | ICD-10-CM | POA: Diagnosis not present

## 2018-10-12 DIAGNOSIS — M9902 Segmental and somatic dysfunction of thoracic region: Secondary | ICD-10-CM | POA: Diagnosis not present

## 2018-10-12 DIAGNOSIS — M9903 Segmental and somatic dysfunction of lumbar region: Secondary | ICD-10-CM | POA: Diagnosis not present

## 2018-10-24 ENCOUNTER — Ambulatory Visit: Payer: Medicare Other

## 2018-10-26 DIAGNOSIS — M9902 Segmental and somatic dysfunction of thoracic region: Secondary | ICD-10-CM | POA: Diagnosis not present

## 2018-10-26 DIAGNOSIS — M5442 Lumbago with sciatica, left side: Secondary | ICD-10-CM | POA: Diagnosis not present

## 2018-10-26 DIAGNOSIS — M9905 Segmental and somatic dysfunction of pelvic region: Secondary | ICD-10-CM | POA: Diagnosis not present

## 2018-10-26 DIAGNOSIS — M9903 Segmental and somatic dysfunction of lumbar region: Secondary | ICD-10-CM | POA: Diagnosis not present

## 2018-10-26 DIAGNOSIS — M955 Acquired deformity of pelvis: Secondary | ICD-10-CM | POA: Diagnosis not present

## 2018-10-27 ENCOUNTER — Encounter: Payer: Self-pay | Admitting: Internal Medicine

## 2018-10-27 ENCOUNTER — Ambulatory Visit (INDEPENDENT_AMBULATORY_CARE_PROVIDER_SITE_OTHER): Payer: Medicare Other | Admitting: Internal Medicine

## 2018-10-27 ENCOUNTER — Other Ambulatory Visit: Payer: Self-pay | Admitting: Internal Medicine

## 2018-10-27 VITALS — BP 128/66 | HR 78 | Ht 66.0 in | Wt 156.0 lb

## 2018-10-27 DIAGNOSIS — R7303 Prediabetes: Secondary | ICD-10-CM

## 2018-10-27 DIAGNOSIS — Z1231 Encounter for screening mammogram for malignant neoplasm of breast: Secondary | ICD-10-CM | POA: Diagnosis not present

## 2018-10-27 DIAGNOSIS — M8589 Other specified disorders of bone density and structure, multiple sites: Secondary | ICD-10-CM | POA: Diagnosis not present

## 2018-10-27 DIAGNOSIS — Z0000 Encounter for general adult medical examination without abnormal findings: Secondary | ICD-10-CM

## 2018-10-27 DIAGNOSIS — E039 Hypothyroidism, unspecified: Secondary | ICD-10-CM | POA: Diagnosis not present

## 2018-10-27 DIAGNOSIS — E782 Mixed hyperlipidemia: Secondary | ICD-10-CM | POA: Diagnosis not present

## 2018-10-27 LAB — POCT URINALYSIS DIPSTICK
BILIRUBIN UA: NEGATIVE
Blood, UA: NEGATIVE
GLUCOSE UA: NEGATIVE
Nitrite, UA: NEGATIVE
Protein, UA: NEGATIVE
Spec Grav, UA: 1.015 (ref 1.010–1.025)
Urobilinogen, UA: 0.2 E.U./dL
pH, UA: 6 (ref 5.0–8.0)

## 2018-10-27 MED ORDER — LEVOTHYROXINE SODIUM 100 MCG PO TABS: 100.0000 ug | ORAL_TABLET | Freq: Every day | ORAL | 3 refills | Status: DC

## 2018-10-27 NOTE — Progress Notes (Signed)
Date:  10/27/2018   Name:  Kelsey Pacheco   DOB:  02/16/1941   MRN:  [id number]   Chief Complaint: Annual Exam (Breast Exam. ) Kelsey Pacheco is a 77 y.o. female who presents today for her Complete Annual Exam. She feels well. She reports exercising daily. She reports she is sleeping well.  She denies breast complaints.  Mammogram is due in February.  Last DEXA 3 yrs ago. She is not interested in Shingrix. All other immunizations are up to date.  Thyroid Problem  Presents for follow-up visit. Patient reports no anxiety, constipation, diarrhea, fatigue, palpitations or tremors. The symptoms have been stable. Her past medical history is significant for hyperlipidemia.  Hyperlipidemia  The problem is controlled. Pertinent negatives include no chest pain or shortness of breath. Current antihyperlipidemic treatment includes statins.    Review of Systems  Constitutional: Negative for appetite change, chills, fatigue, fever and unexpected weight change.  HENT: Negative for congestion, hearing loss, tinnitus, trouble swallowing and voice change.   Eyes: Negative for visual disturbance.  Respiratory: Negative for cough, chest tightness, shortness of breath and wheezing.   Cardiovascular: Negative for chest pain, palpitations and leg swelling.  Gastrointestinal: Negative for abdominal pain, constipation, diarrhea and vomiting.  Endocrine: Negative for polydipsia and polyuria.  Genitourinary: Negative for dysuria, frequency, genital sores, hematuria, pelvic pain, vaginal bleeding and vaginal discharge.  Musculoskeletal: Negative for arthralgias, gait problem and joint swelling.  Skin: Negative for color change and rash.  Allergic/Immunologic: Negative for environmental allergies.  Neurological: Negative for dizziness, tremors, light-headedness, numbness and headaches.  Hematological: Negative for adenopathy. Does not bruise/bleed easily.  Psychiatric/Behavioral: Negative for  dysphoric mood and sleep disturbance. The patient is not nervous/anxious.     Patient Active Problem List   Diagnosis Date Noted  . 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 PAC (premature atrial contraction) 10/25/2017  . Left-sided low back pain with left-sided sciatica 04/01/2016  . Osteopenia 11/20/2015  . Allergic rhinitis 11/15/2015  . Body mass index (BMI) of 25.0-25.9 in adult 11/15/2015  . Prediabetes 11/15/2015  . Adult hypothyroidism 10/16/2015  . Hyperlipidemia 06/24/2015    Allergies  Allergen Reactions  . Fosamax [Alendronate] Other (See Comments)    Body aches    Past Surgical History:  Procedure Laterality Date  . BASAL CELL CARCINOMA EXCISION  06/04/11    [facility] (Mohs procedure) left orbital Dr. Cheryll DessertMerrit  . BREAST BIOPSY Right 2006   bx/clip-neg  . CATARACT EXTRACTION W/PHACO Right 03/16/2018   Procedure: CATARACT EXTRACTION PHACO AND INTRAOCULAR LENS PLACEMENT (I[facility]) RIGHT;  Surgeon: Lockie MolaBrasington, Chadwick, MD;  [facility];  Service: Ophthalmology;  Laterality: Right;  requests early  . Cologuard home test  12/2015   Negative  . RETINAL DETACHMENT SURGERY Right 2018    Social History   Tobacco Use  . Smoking status: Never Smoker  . Smokeless tobacco: Never Used  Substance Use Topics  . Alcohol use: No  . Drug use: No     Medication list has been reviewed and updated.  Current Meds  Medication Sig  . Ascorbic Acid (VITAMIN C) 100 MG tablet Take 1 tablet by mouth daily.  Marland Kitchen. aspirin 81 MG tablet Take 1 tablet by mouth daily.  Marland Kitchen. atorvastatin (LIPITOR) 40 MG tablet TAKE 1 TABLET BY MOUTH EVERY DAY  . Calcium Carbonate (CALCIUM 600 PO) Take 1 tablet by mouth daily.  Marland Kitchen. levothyroxine (SYNTHROID, LEVOTHROID) 100 MCG tablet TAKE 1 TABLET BY MOUTH EVERY DAY  . MULTIPLE VITAMIN  PO Take 1 tablet by mouth daily.  . Vitamin D, Cholecalciferol, 1000 UNITS TABS Take 1 tablet by mouth daily.  . Vitamin E 100 UNITS TABS Take 1 capsule by mouth daily.    PHQ  2/9 Scores 10/27/2018 09/06/2017 10/23/2016 04/01/2016  PHQ - 2 Score 0 0 0 0    Physical Exam  Constitutional: She is oriented to person, place, and time. She appears well-developed and well-nourished. No distress.  HENT:  Head: Normocephalic and atraumatic.  Right Ear: Tympanic membrane and ear canal normal.  Left Ear: Tympanic membrane and ear canal normal.  Nose: Right sinus exhibits no maxillary sinus tenderness. Left sinus exhibits no maxillary sinus tenderness.  Mouth/Throat: Uvula is midline and oropharynx is clear and moist.  Eyes: Conjunctivae and EOM are normal. Right eye exhibits no discharge. Left eye exhibits no discharge. No scleral icterus.  Neck: Normal range of motion. Carotid bruit is not present. No erythema present. No thyromegaly present.  Cardiovascular: Normal rate, regular rhythm, normal heart sounds and normal pulses.  Pulmonary/Chest: Effort normal. No respiratory distress. She has no wheezes. Right breast exhibits no mass, no nipple discharge, no skin change and no tenderness. Left breast exhibits no mass, no nipple discharge, no skin change and no tenderness.  Abdominal: Soft. Bowel sounds are normal. There is no hepatosplenomegaly. There is no tenderness. There is no CVA tenderness.  Musculoskeletal: Normal range of motion.  Lymphadenopathy:    She has no cervical adenopathy.    She has no axillary adenopathy.  Neurological: She is alert and oriented to person, place, and time. She has normal reflexes. No cranial nerve deficit or sensory deficit.  Skin: Skin is warm, dry and intact. No rash noted.  Psychiatric: She has a normal mood and affect. Her speech is normal and behavior is normal. Thought content normal.  Nursing note and vitals reviewed.   BP 128/66 (BP Location: Right Arm, Patient Position: Sitting, Cuff Size: Normal)   Pulse 78   Ht 5\' 6"  (1.676 m)   Wt 156 lb (70.8 kg)   SpO2 98%   BMI 25.18 kg/m   Assessment and Plan: 1. Annual physical  exam Normal exam, continue healthy diet and regular exercise No urinary sx reported despite bactiuria - POCT urinalysis dipstick  2. Encounter for screening mammogram for breast cancer Schedule in February - MM 3D SCREEN BREAST BILATERAL; Future  3. Adult hypothyroidism supplemented - TSH - levothyroxine (SYNTHROID, LEVOTHROID) 100 MCG tablet; Take 1 tablet (100 mcg total) by mouth daily.  Dispense: 90 tablet; Refill: 3  4. Mixed hyperlipidemia - Lipid panel  5. Prediabetes Check labs - CBC with Differential/Platelet - Comprehensive metabolic panel - Hemoglobin A1c  6. Osteopenia of multiple sites Due for repeat testing - HM DEXA SCAN   Partially dictated using Animal nutritionist. Any errors are unintentional.  Bari Edward, MD [facility]  10/27/2018

## 2018-10-27 NOTE — Patient Instructions (Signed)
Health Maintenance for Postmenopausal Women Menopause is a normal process in which your reproductive ability comes to an end. This process happens gradually over a span of months to years, usually between the ages of 22 and 9. Menopause is complete when you have missed 12 consecutive menstrual periods. It is important to talk with your health care provider about some of the most common conditions that affect postmenopausal women, such as heart disease, cancer, and bone loss (osteoporosis). Adopting a healthy lifestyle and getting preventive care can help to promote your health and wellness. Those actions can also lower your chances of developing some of these common conditions. What should I know about menopause? During menopause, you may experience a number of symptoms, such as:  Moderate-to-severe hot flashes.  Night sweats.  Decrease in sex drive.  Mood swings.  Headaches.  Tiredness.  Irritability.  Memory problems.  Insomnia.  Choosing to treat or not to treat menopausal changes is an individual decision that you make with your health care provider. What should I know about hormone replacement therapy and supplements? Hormone therapy products are effective for treating symptoms that are associated with menopause, such as hot flashes and night sweats. Hormone replacement carries certain risks, especially as you become older. If you are thinking about using estrogen or estrogen with progestin treatments, discuss the benefits and risks with your health care provider. What should I know about heart disease and stroke? Heart disease, heart attack, and stroke become more likely as you age. This may be due, in part, to the hormonal changes that your body experiences during menopause. These can affect how your body processes dietary fats, triglycerides, and cholesterol. Heart attack and stroke are both medical emergencies. There are many things that you can do to help prevent heart disease  and stroke:  Have your blood pressure checked at least every 1-2 years. High blood pressure causes heart disease and increases the risk of stroke.  If you are 53-22 years old, ask your health care provider if you should take aspirin to prevent a heart attack or a stroke.  Do not use any tobacco products, including cigarettes, chewing tobacco, or electronic cigarettes. If you need help quitting, ask your health care provider.  It is important to eat a healthy diet and maintain a healthy weight. ? Be sure to include plenty of vegetables, fruits, low-fat dairy products, and lean protein. ? Avoid eating foods that are high in solid fats, added sugars, or salt (sodium).  Get regular exercise. This is one of the most important things that you can do for your health. ? Try to exercise for at least 150 minutes each week. The type of exercise that you do should increase your heart rate and make you sweat. This is known as moderate-intensity exercise. ? Try to do strengthening exercises at least twice each week. Do these in addition to the moderate-intensity exercise.  Know your numbers.Ask your health care provider to check your cholesterol and your blood glucose. Continue to have your blood tested as directed by your health care provider.  What should I know about cancer screening? There are several types of cancer. Take the following steps to reduce your risk and to catch any cancer development as early as possible. Breast Cancer  Practice breast self-awareness. ? This means understanding how your breasts normally appear and feel. ? It also means doing regular breast self-exams. Let your health care provider know about any changes, no matter how small.  If you are 40  or older, have a clinician do a breast exam (clinical breast exam or CBE) every year. Depending on your age, family history, and medical history, it may be recommended that you also have a yearly breast X-ray (mammogram).  If you  have a family history of breast cancer, talk with your health care provider about genetic screening.  If you are at high risk for breast cancer, talk with your health care provider about having an MRI and a mammogram every year.  Breast cancer (BRCA) gene test is recommended for women who have family members with BRCA-related cancers. Results of the assessment will determine the need for genetic counseling and BRCA1 and for BRCA2 testing. BRCA-related cancers include these types: ? Breast. This occurs in males or females. ? Ovarian. ? Tubal. This may also be called fallopian tube cancer. ? Cancer of the abdominal or pelvic lining (peritoneal cancer). ? Prostate. ? Pancreatic.  Cervical, Uterine, and Ovarian Cancer Your health care provider may recommend that you be screened regularly for cancer of the pelvic organs. These include your ovaries, uterus, and vagina. This screening involves a pelvic exam, which includes checking for microscopic changes to the surface of your cervix (Pap test).  For women ages 21-65, health care providers may recommend a pelvic exam and a Pap test every three years. For women ages 79-65, they may recommend the Pap test and pelvic exam, combined with testing for human papilloma virus (HPV), every five years. Some types of HPV increase your risk of cervical cancer. Testing for HPV may also be done on women of any age who have unclear Pap test results.  Other health care providers may not recommend any screening for nonpregnant women who are considered low risk for pelvic cancer and have no symptoms. Ask your health care provider if a screening pelvic exam is right for you.  If you have had past treatment for cervical cancer or a condition that could lead to cancer, you need Pap tests and screening for cancer for at least 20 years after your treatment. If Pap tests have been discontinued for you, your risk factors (such as having a new sexual partner) need to be  reassessed to determine if you should start having screenings again. Some women have medical problems that increase the chance of getting cervical cancer. In these cases, your health care provider may recommend that you have screening and Pap tests more often.  If you have a family history of uterine cancer or ovarian cancer, talk with your health care provider about genetic screening.  If you have vaginal bleeding after reaching menopause, tell your health care provider.  There are currently no reliable tests available to screen for ovarian cancer.  Lung Cancer Lung cancer screening is recommended for adults 69-62 years old who are at high risk for lung cancer because of a history of smoking. A yearly low-dose CT scan of the lungs is recommended if you:  Currently smoke.  Have a history of at least 30 pack-years of smoking and you currently smoke or have quit within the past 15 years. A pack-year is smoking an average of one pack of cigarettes per day for one year.  Yearly screening should:  Continue until it has been 15 years since you quit.  Stop if you develop a health problem that would prevent you from having lung cancer treatment.  Colorectal Cancer  This type of cancer can be detected and can often be prevented.  Routine colorectal cancer screening usually begins at  age 42 and continues through age 45.  If you have risk factors for colon cancer, your health care provider may recommend that you be screened at an earlier age.  If you have a family history of colorectal cancer, talk with your health care provider about genetic screening.  Your health care provider may also recommend using home test kits to check for hidden blood in your stool.  A small camera at the end of a tube can be used to examine your colon directly (sigmoidoscopy or colonoscopy). This is done to check for the earliest forms of colorectal cancer.  Direct examination of the colon should be repeated every  5-10 years until age 71. However, if early forms of precancerous polyps or small growths are found or if you have a family history or genetic risk for colorectal cancer, you may need to be screened more often.  Skin Cancer  Check your skin from head to toe regularly.  Monitor any moles. Be sure to tell your health care provider: ? About any new moles or changes in moles, especially if there is a change in a mole's shape or color. ? If you have a mole that is larger than the size of a pencil eraser.  If any of your family members has a history of skin cancer, especially at a young age, talk with your health care provider about genetic screening.  Always use sunscreen. Apply sunscreen liberally and repeatedly throughout the day.  Whenever you are outside, protect yourself by wearing long sleeves, pants, a wide-brimmed hat, and sunglasses.  What should I know about osteoporosis? Osteoporosis is a condition in which bone destruction happens more quickly than new bone creation. After menopause, you may be at an increased risk for osteoporosis. To help prevent osteoporosis or the bone fractures that can happen because of osteoporosis, the following is recommended:  If you are 46-71 years old, get at least 1,000 mg of calcium and at least 600 mg of vitamin D per day.  If you are older than age 55 but younger than age 65, get at least 1,200 mg of calcium and at least 600 mg of vitamin D per day.  If you are older than age 54, get at least 1,200 mg of calcium and at least 800 mg of vitamin D per day.  Smoking and excessive alcohol intake increase the risk of osteoporosis. Eat foods that are rich in calcium and vitamin D, and do weight-bearing exercises several times each week as directed by your health care provider. What should I know about how menopause affects my mental health? Depression may occur at any age, but it is more common as you become older. Common symptoms of depression  include:  Low or sad mood.  Changes in sleep patterns.  Changes in appetite or eating patterns.  Feeling an overall lack of motivation or enjoyment of activities that you previously enjoyed.  Frequent crying spells.  Talk with your health care provider if you think that you are experiencing depression. What should I know about immunizations? It is important that you get and maintain your immunizations. These include:  Tetanus, diphtheria, and pertussis (Tdap) booster vaccine.  Influenza every year before the flu season begins.  Pneumonia vaccine.  Shingles vaccine.  Your health care provider may also recommend other immunizations. This information is not intended to replace advice given to you by your health care provider. Make sure you discuss any questions you have with your health care provider. Document Released: 01/01/2006  Document Revised: 05/29/2016 Document Reviewed: 08/13/2015 Elsevier Interactive Patient Education  2018 Elsevier Inc.  

## 2018-10-28 LAB — CBC WITH DIFFERENTIAL/PLATELET
BASOS ABS: 0.1 10*3/uL (ref 0.0–0.2)
Basos: 1 %
EOS (ABSOLUTE): 0.1 10*3/uL (ref 0.0–0.4)
Eos: 1 %
Hematocrit: 41.8 % (ref 34.0–46.6)
Hemoglobin: 13.7 g/dL (ref 11.1–15.9)
IMMATURE GRANS (ABS): 0 10*3/uL (ref 0.0–0.1)
IMMATURE GRANULOCYTES: 0 %
LYMPHS: 19 %
Lymphocytes Absolute: 1.3 10*3/uL (ref 0.7–3.1)
MCH: 30 pg (ref 26.6–33.0)
MCHC: 32.8 g/dL (ref 31.5–35.7)
MCV: 92 fL (ref 79–97)
Monocytes Absolute: 0.5 10*3/uL (ref 0.1–0.9)
Monocytes: 7 %
NEUTROS PCT: 72 %
Neutrophils Absolute: 4.9 10*3/uL (ref 1.4–7.0)
PLATELETS: 267 10*3/uL (ref 150–450)
RBC: 4.57 x10E6/uL (ref 3.77–5.28)
RDW: 12 % — ABNORMAL LOW (ref 12.3–15.4)
WBC: 6.8 10*3/uL (ref 3.4–10.8)

## 2018-10-28 LAB — TSH: TSH: 2.65 u[IU]/mL (ref 0.450–4.500)

## 2018-10-28 LAB — LIPID PANEL
Chol/HDL Ratio: 2.3 ratio (ref 0.0–4.4)
Cholesterol, Total: 199 mg/dL (ref 100–199)
HDL: 85 mg/dL (ref >39)
LDL CALC: 91 mg/dL (ref 0–99)
Triglycerides: 116 mg/dL (ref 0–149)
VLDL Cholesterol Cal: 23 mg/dL (ref 5–40)

## 2018-10-28 LAB — COMPREHENSIVE METABOLIC PANEL
ALT: 26 IU/L (ref 0–32)
AST: 28 IU/L (ref 0–40)
Albumin/Globulin Ratio: 2 (ref 1.2–2.2)
Albumin: 4.7 g/dL (ref 3.5–4.8)
Alkaline Phosphatase: 100 IU/L (ref 39–117)
BILIRUBIN TOTAL: 0.8 mg/dL (ref 0.0–1.2)
BUN/Creatinine Ratio: 23 (ref 12–28)
BUN: 19 mg/dL (ref 8–27)
CALCIUM: 10.3 mg/dL (ref 8.7–10.3)
CHLORIDE: 99 mmol/L (ref 96–106)
CO2: 22 mmol/L (ref 20–29)
Creatinine, Ser: 0.83 mg/dL (ref 0.57–1.00)
GFR calc non Af Amer: 68 mL/min/{1.73_m2} (ref >59)
GFR, EST AFRICAN AMERICAN: 79 mL/min/{1.73_m2} (ref >59)
GLUCOSE: 123 mg/dL — AB (ref 65–99)
Globulin, Total: 2.3 g/dL (ref 1.5–4.5)
Potassium: 4.5 mmol/L (ref 3.5–5.2)
Sodium: 140 mmol/L (ref 134–144)
TOTAL PROTEIN: 7 g/dL (ref 6.0–8.5)

## 2018-10-28 LAB — HEMOGLOBIN A1C
Est. average glucose Bld gHb Est-mCnc: 137 mg/dL
Hgb A1c MFr Bld: 6.4 % — ABNORMAL HIGH (ref 4.8–5.6)

## 2018-11-10 DIAGNOSIS — M9902 Segmental and somatic dysfunction of thoracic region: Secondary | ICD-10-CM | POA: Diagnosis not present

## 2018-11-10 DIAGNOSIS — M9903 Segmental and somatic dysfunction of lumbar region: Secondary | ICD-10-CM | POA: Diagnosis not present

## 2018-11-10 DIAGNOSIS — M5442 Lumbago with sciatica, left side: Secondary | ICD-10-CM | POA: Diagnosis not present

## 2018-11-10 DIAGNOSIS — M955 Acquired deformity of pelvis: Secondary | ICD-10-CM | POA: Diagnosis not present

## 2018-11-10 DIAGNOSIS — M9905 Segmental and somatic dysfunction of pelvic region: Secondary | ICD-10-CM | POA: Diagnosis not present

## 2018-11-29 DIAGNOSIS — M5442 Lumbago with sciatica, left side: Secondary | ICD-10-CM | POA: Diagnosis not present

## 2018-11-29 DIAGNOSIS — M955 Acquired deformity of pelvis: Secondary | ICD-10-CM | POA: Diagnosis not present

## 2018-11-29 DIAGNOSIS — M9902 Segmental and somatic dysfunction of thoracic region: Secondary | ICD-10-CM | POA: Diagnosis not present

## 2018-11-29 DIAGNOSIS — M9903 Segmental and somatic dysfunction of lumbar region: Secondary | ICD-10-CM | POA: Diagnosis not present

## 2018-11-29 DIAGNOSIS — M9905 Segmental and somatic dysfunction of pelvic region: Secondary | ICD-10-CM | POA: Diagnosis not present

## 2018-12-12 ENCOUNTER — Ambulatory Visit: Payer: Medicare Other

## 2018-12-13 DIAGNOSIS — M9903 Segmental and somatic dysfunction of lumbar region: Secondary | ICD-10-CM | POA: Diagnosis not present

## 2018-12-13 DIAGNOSIS — M9902 Segmental and somatic dysfunction of thoracic region: Secondary | ICD-10-CM | POA: Diagnosis not present

## 2018-12-13 DIAGNOSIS — M5442 Lumbago with sciatica, left side: Secondary | ICD-10-CM | POA: Diagnosis not present

## 2018-12-13 DIAGNOSIS — M955 Acquired deformity of pelvis: Secondary | ICD-10-CM | POA: Diagnosis not present

## 2018-12-13 DIAGNOSIS — M9905 Segmental and somatic dysfunction of pelvic region: Secondary | ICD-10-CM | POA: Diagnosis not present

## 2018-12-16 ENCOUNTER — Telehealth: Payer: Self-pay | Admitting: Internal Medicine

## 2018-12-16 NOTE — Telephone Encounter (Signed)
Called to Reschedule Medicare Annual Wellness Visit with the Nurse Health Advisor. No answer on both Home and Mobile numbers.  Left message on Home answering machine.  Mobile number not able to accept messages.   If patient returns call, please note: their last AWV was on 09/06/17, please schedule AWV with NHA any date AFTER 09/06/2018  Thank you! For any questions please contact: Trixie Rude at [phone] or Skype [email or web address]

## 2018-12-27 DIAGNOSIS — M9902 Segmental and somatic dysfunction of thoracic region: Secondary | ICD-10-CM | POA: Diagnosis not present

## 2018-12-27 DIAGNOSIS — M9905 Segmental and somatic dysfunction of pelvic region: Secondary | ICD-10-CM | POA: Diagnosis not present

## 2018-12-27 DIAGNOSIS — M955 Acquired deformity of pelvis: Secondary | ICD-10-CM | POA: Diagnosis not present

## 2018-12-27 DIAGNOSIS — M5442 Lumbago with sciatica, left side: Secondary | ICD-10-CM | POA: Diagnosis not present

## 2018-12-27 DIAGNOSIS — M9903 Segmental and somatic dysfunction of lumbar region: Secondary | ICD-10-CM | POA: Diagnosis not present

## 2019-01-02 NOTE — Telephone Encounter (Signed)
Called to schedule Medicare Annual Wellness Visit with the Nurse Health Advisor. No answer at home number and Mobile number.  Left message on Home answering machine.  Mobile number did not have a voicemail to leave a message.  If patient returns call, please note: their last AWV was on 09/06/17 please schedule AWV with NHA any date AFTER 09/06/2018  Thank you! For any questions please contact: Trixie Rude at [phone] or Skype [email or web address]

## 2019-01-09 ENCOUNTER — Ambulatory Visit
Admission: RE | Admit: 2019-01-09 | Discharge: 2019-01-09 | Disposition: A | Discharge status: Home / Self Care | Payer: Medicare Other | Source: Ambulatory Visit | Attending: Internal Medicine | Admitting: Internal Medicine

## 2019-01-09 ENCOUNTER — Encounter (INDEPENDENT_AMBULATORY_CARE_PROVIDER_SITE_OTHER): Payer: Self-pay

## 2019-01-09 DIAGNOSIS — Z1231 Encounter for screening mammogram for malignant neoplasm of breast: Secondary | ICD-10-CM | POA: Diagnosis not present

## 2019-01-10 DIAGNOSIS — M9902 Segmental and somatic dysfunction of thoracic region: Secondary | ICD-10-CM | POA: Diagnosis not present

## 2019-01-10 DIAGNOSIS — M9903 Segmental and somatic dysfunction of lumbar region: Secondary | ICD-10-CM | POA: Diagnosis not present

## 2019-01-10 DIAGNOSIS — M5442 Lumbago with sciatica, left side: Secondary | ICD-10-CM | POA: Diagnosis not present

## 2019-01-10 DIAGNOSIS — M955 Acquired deformity of pelvis: Secondary | ICD-10-CM | POA: Diagnosis not present

## 2019-01-10 DIAGNOSIS — M9905 Segmental and somatic dysfunction of pelvic region: Secondary | ICD-10-CM | POA: Diagnosis not present

## 2019-01-13 NOTE — Telephone Encounter (Signed)
Called to schedule Medicare Annual Wellness Visit with the Nurse Health Advisor. No answer on Home and Mobile number.  Left message on Home answer machine.  Mobile number didn't have a voicemail to leave a message.    If patient returns call, please note: their last AWV was on 09/06/2017, please schedule AWV with NHA any date AFTER 09/06/2018.  Thank you! For any questions please contact: Trixie Rude at [phone] or Skype [email or web address]

## 2019-01-24 DIAGNOSIS — M9903 Segmental and somatic dysfunction of lumbar region: Secondary | ICD-10-CM | POA: Diagnosis not present

## 2019-01-24 DIAGNOSIS — M9902 Segmental and somatic dysfunction of thoracic region: Secondary | ICD-10-CM | POA: Diagnosis not present

## 2019-01-24 DIAGNOSIS — M955 Acquired deformity of pelvis: Secondary | ICD-10-CM | POA: Diagnosis not present

## 2019-01-24 DIAGNOSIS — M9905 Segmental and somatic dysfunction of pelvic region: Secondary | ICD-10-CM | POA: Diagnosis not present

## 2019-01-24 DIAGNOSIS — M5442 Lumbago with sciatica, left side: Secondary | ICD-10-CM | POA: Diagnosis not present

## 2019-02-07 DIAGNOSIS — M5442 Lumbago with sciatica, left side: Secondary | ICD-10-CM | POA: Diagnosis not present

## 2019-02-07 DIAGNOSIS — M9905 Segmental and somatic dysfunction of pelvic region: Secondary | ICD-10-CM | POA: Diagnosis not present

## 2019-02-07 DIAGNOSIS — M9903 Segmental and somatic dysfunction of lumbar region: Secondary | ICD-10-CM | POA: Diagnosis not present

## 2019-02-07 DIAGNOSIS — M9902 Segmental and somatic dysfunction of thoracic region: Secondary | ICD-10-CM | POA: Diagnosis not present

## 2019-02-07 DIAGNOSIS — M955 Acquired deformity of pelvis: Secondary | ICD-10-CM | POA: Diagnosis not present

## 2019-02-15 ENCOUNTER — Telehealth: Payer: Self-pay

## 2019-02-15 NOTE — Telephone Encounter (Signed)
Left message for patient regarding need for AWV and available to complete telephonically. Requested patient to contact the office at [phone] to schedule telephone visit.   Telephone AWV's currently available to schedule only during the next 2 weeks during Covid-19 pandemic.   Thank you! 

## 2019-02-26 ENCOUNTER — Other Ambulatory Visit: Payer: Self-pay | Admitting: Internal Medicine

## 2019-02-28 DIAGNOSIS — M5442 Lumbago with sciatica, left side: Secondary | ICD-10-CM | POA: Diagnosis not present

## 2019-02-28 DIAGNOSIS — M9905 Segmental and somatic dysfunction of pelvic region: Secondary | ICD-10-CM | POA: Diagnosis not present

## 2019-02-28 DIAGNOSIS — M9903 Segmental and somatic dysfunction of lumbar region: Secondary | ICD-10-CM | POA: Diagnosis not present

## 2019-02-28 DIAGNOSIS — M9902 Segmental and somatic dysfunction of thoracic region: Secondary | ICD-10-CM | POA: Diagnosis not present

## 2019-02-28 DIAGNOSIS — M955 Acquired deformity of pelvis: Secondary | ICD-10-CM | POA: Diagnosis not present

## 2019-03-01 ENCOUNTER — Telehealth: Payer: Self-pay | Admitting: Internal Medicine

## 2019-03-01 NOTE — Telephone Encounter (Signed)
Called to RE-schedule Medicare Annual Wellness Visit with Nurse Health Advisor.   If patient returns call, please schedule AWV with NHA ~week before CPE in December?  For any questions please contact:  Manuela Schwartz [phone] or skype at: [email or web address]

## 2019-03-03 NOTE — Telephone Encounter (Signed)
2ND ATTEMPT

## 2019-03-14 DIAGNOSIS — M5442 Lumbago with sciatica, left side: Secondary | ICD-10-CM | POA: Diagnosis not present

## 2019-03-14 DIAGNOSIS — M955 Acquired deformity of pelvis: Secondary | ICD-10-CM | POA: Diagnosis not present

## 2019-03-14 DIAGNOSIS — M9905 Segmental and somatic dysfunction of pelvic region: Secondary | ICD-10-CM | POA: Diagnosis not present

## 2019-03-14 DIAGNOSIS — M9902 Segmental and somatic dysfunction of thoracic region: Secondary | ICD-10-CM | POA: Diagnosis not present

## 2019-03-14 DIAGNOSIS — M9903 Segmental and somatic dysfunction of lumbar region: Secondary | ICD-10-CM | POA: Diagnosis not present

## 2019-03-16 ENCOUNTER — Encounter: Payer: Self-pay | Admitting: Internal Medicine

## 2019-03-16 NOTE — Telephone Encounter (Signed)
3rd attempt

## 2019-03-28 DIAGNOSIS — M955 Acquired deformity of pelvis: Secondary | ICD-10-CM | POA: Diagnosis not present

## 2019-03-28 DIAGNOSIS — M9902 Segmental and somatic dysfunction of thoracic region: Secondary | ICD-10-CM | POA: Diagnosis not present

## 2019-03-28 DIAGNOSIS — M9905 Segmental and somatic dysfunction of pelvic region: Secondary | ICD-10-CM | POA: Diagnosis not present

## 2019-03-28 DIAGNOSIS — M9903 Segmental and somatic dysfunction of lumbar region: Secondary | ICD-10-CM | POA: Diagnosis not present

## 2019-03-28 DIAGNOSIS — M5442 Lumbago with sciatica, left side: Secondary | ICD-10-CM | POA: Diagnosis not present

## 2019-04-11 DIAGNOSIS — M5442 Lumbago with sciatica, left side: Secondary | ICD-10-CM | POA: Diagnosis not present

## 2019-04-11 DIAGNOSIS — M9905 Segmental and somatic dysfunction of pelvic region: Secondary | ICD-10-CM | POA: Diagnosis not present

## 2019-04-11 DIAGNOSIS — M955 Acquired deformity of pelvis: Secondary | ICD-10-CM | POA: Diagnosis not present

## 2019-04-11 DIAGNOSIS — M9902 Segmental and somatic dysfunction of thoracic region: Secondary | ICD-10-CM | POA: Diagnosis not present

## 2019-04-11 DIAGNOSIS — M9903 Segmental and somatic dysfunction of lumbar region: Secondary | ICD-10-CM | POA: Diagnosis not present

## 2019-04-25 DIAGNOSIS — M5442 Lumbago with sciatica, left side: Secondary | ICD-10-CM | POA: Diagnosis not present

## 2019-04-25 DIAGNOSIS — M9905 Segmental and somatic dysfunction of pelvic region: Secondary | ICD-10-CM | POA: Diagnosis not present

## 2019-04-25 DIAGNOSIS — M9902 Segmental and somatic dysfunction of thoracic region: Secondary | ICD-10-CM | POA: Diagnosis not present

## 2019-04-25 DIAGNOSIS — M955 Acquired deformity of pelvis: Secondary | ICD-10-CM | POA: Diagnosis not present

## 2019-04-25 DIAGNOSIS — M9903 Segmental and somatic dysfunction of lumbar region: Secondary | ICD-10-CM | POA: Diagnosis not present

## 2019-05-01 ENCOUNTER — Ambulatory Visit: Payer: Medicare Other | Admitting: Internal Medicine

## 2019-05-09 DIAGNOSIS — M9902 Segmental and somatic dysfunction of thoracic region: Secondary | ICD-10-CM | POA: Diagnosis not present

## 2019-05-09 DIAGNOSIS — M5442 Lumbago with sciatica, left side: Secondary | ICD-10-CM | POA: Diagnosis not present

## 2019-05-09 DIAGNOSIS — M9903 Segmental and somatic dysfunction of lumbar region: Secondary | ICD-10-CM | POA: Diagnosis not present

## 2019-05-09 DIAGNOSIS — M9905 Segmental and somatic dysfunction of pelvic region: Secondary | ICD-10-CM | POA: Diagnosis not present

## 2019-05-09 DIAGNOSIS — M955 Acquired deformity of pelvis: Secondary | ICD-10-CM | POA: Diagnosis not present

## 2019-05-23 DIAGNOSIS — M9903 Segmental and somatic dysfunction of lumbar region: Secondary | ICD-10-CM | POA: Diagnosis not present

## 2019-05-23 DIAGNOSIS — M9902 Segmental and somatic dysfunction of thoracic region: Secondary | ICD-10-CM | POA: Diagnosis not present

## 2019-05-23 DIAGNOSIS — M5442 Lumbago with sciatica, left side: Secondary | ICD-10-CM | POA: Diagnosis not present

## 2019-05-23 DIAGNOSIS — M9905 Segmental and somatic dysfunction of pelvic region: Secondary | ICD-10-CM | POA: Diagnosis not present

## 2019-05-23 DIAGNOSIS — M955 Acquired deformity of pelvis: Secondary | ICD-10-CM | POA: Diagnosis not present

## 2019-07-24 DIAGNOSIS — M9905 Segmental and somatic dysfunction of pelvic region: Secondary | ICD-10-CM | POA: Diagnosis not present

## 2019-07-24 DIAGNOSIS — M9902 Segmental and somatic dysfunction of thoracic region: Secondary | ICD-10-CM | POA: Diagnosis not present

## 2019-07-24 DIAGNOSIS — M5442 Lumbago with sciatica, left side: Secondary | ICD-10-CM | POA: Diagnosis not present

## 2019-07-24 DIAGNOSIS — M9903 Segmental and somatic dysfunction of lumbar region: Secondary | ICD-10-CM | POA: Diagnosis not present

## 2019-07-24 DIAGNOSIS — M955 Acquired deformity of pelvis: Secondary | ICD-10-CM | POA: Diagnosis not present

## 2019-10-04 ENCOUNTER — Telehealth: Payer: Self-pay | Admitting: Internal Medicine

## 2019-10-04 NOTE — Telephone Encounter (Signed)
Called to schedule Medicare Annual Wellness Visit with Nurse Health Advisor, Kasey Uthus at [facility]. If patient returns call, please schedule AWV with NHA ~ Any date on NHA schedule (Mon or Wed)  °Questions regarding scheduling, please call  [phone] or MS Teams > [email or web address]  ° °Kathryn Brown  °Care Guide • Embedded Care Coordination °[facility]   Care Management °[email or web address]   [phone]  ° ° ° °

## 2019-10-19 ENCOUNTER — Other Ambulatory Visit: Payer: Self-pay | Admitting: Internal Medicine

## 2019-10-19 DIAGNOSIS — E039 Hypothyroidism, unspecified: Secondary | ICD-10-CM

## 2019-10-30 ENCOUNTER — Ambulatory Visit (INDEPENDENT_AMBULATORY_CARE_PROVIDER_SITE_OTHER): Payer: Medicare Other | Admitting: Internal Medicine

## 2019-10-30 ENCOUNTER — Other Ambulatory Visit: Payer: Self-pay

## 2019-10-30 ENCOUNTER — Encounter: Payer: Self-pay | Admitting: Internal Medicine

## 2019-10-30 VITALS — BP 120/72 | HR 88 | Ht 66.0 in | Wt 154.0 lb

## 2019-10-30 DIAGNOSIS — E782 Mixed hyperlipidemia: Secondary | ICD-10-CM | POA: Diagnosis not present

## 2019-10-30 DIAGNOSIS — E039 Hypothyroidism, unspecified: Secondary | ICD-10-CM

## 2019-10-30 DIAGNOSIS — Z1231 Encounter for screening mammogram for malignant neoplasm of breast: Secondary | ICD-10-CM | POA: Diagnosis not present

## 2019-10-30 DIAGNOSIS — M8589 Other specified disorders of bone density and structure, multiple sites: Secondary | ICD-10-CM

## 2019-10-30 DIAGNOSIS — Z0000 Encounter for general adult medical examination without abnormal findings: Secondary | ICD-10-CM | POA: Diagnosis not present

## 2019-10-30 DIAGNOSIS — I491 Atrial premature depolarization: Secondary | ICD-10-CM | POA: Diagnosis not present

## 2019-10-30 LAB — POCT URINALYSIS DIPSTICK
Bilirubin, UA: NEGATIVE
Blood, UA: NEGATIVE
Glucose, UA: NEGATIVE
Ketones, UA: NEGATIVE
Leukocytes, UA: NEGATIVE
Nitrite, UA: NEGATIVE
Protein, UA: NEGATIVE
Spec Grav, UA: 1.015 (ref 1.010–1.025)
Urobilinogen, UA: 0.2 E.U./dL
pH, UA: 6 (ref 5.0–8.0)

## 2019-10-30 NOTE — Progress Notes (Signed)
Date:  10/30/2019   Name:  Kelsey Pacheco   DOB:  1941-01-05   MRN:  [id number]   Chief Complaint: Annual Exam (Breast exam- mammogram. ) Kelsey Pacheco is a 78 y.o. female who presents today for her Complete Annual Exam. She feels well. She reports exercising walking and using exercise bike. She reports she is sleeping fairly well. She denies breast issues.  Mammogram  12/2018 DEXA  2017  Colonoscopy  2007 Immunization History  Administered Date(s) Administered  . Influenza, High Dose Seasonal PF 09/06/2017, 09/12/2018  . Influenza,inj,Quad PF,6+ Mos 09/21/2016, 09/07/2019  . Influenza-Unspecified 08/23/2013, 09/12/2018, 09/07/2019  . Pneumococcal Conjugate-13 04/23/2014  . Pneumococcal Polysaccharide-23 09/14/2007  . Td 09/14/2007, 08/29/2011  . Tdap 08/29/2011  . Zoster 05/30/2011    Thyroid Problem Presents for follow-up visit. Patient reports no anxiety, constipation, diarrhea, fatigue, hair loss, palpitations, tremors or weight gain. The symptoms have been stable. Her past medical history is significant for hyperlipidemia.  Hyperlipidemia The problem is controlled. Pertinent negatives include no chest pain or shortness of breath. Current antihyperlipidemic treatment includes statins. The current treatment provides significant improvement of lipids.    Lab Results  Component Value Date   CREATININE 0.83 10/27/2018   BUN 19 10/27/2018   NA 140 10/27/2018   K 4.5 10/27/2018   CL 99 10/27/2018   CO2 22 10/27/2018   Lab Results  Component Value Date   CHOL 199 10/27/2018   HDL 85 10/27/2018   LDLCALC 91 10/27/2018   TRIG 116 10/27/2018   CHOLHDL 2.3 10/27/2018   Lab Results  Component Value Date   TSH 2.650 10/27/2018   Lab Results  Component Value Date   HGBA1C 6.4 (H) 10/27/2018     Review of Systems  Constitutional: Negative for chills, fatigue, fever and weight gain.  HENT: Negative for congestion, hearing loss, tinnitus, trouble  swallowing and voice change.   Eyes: Negative for visual disturbance.  Respiratory: Negative for cough, chest tightness, shortness of breath and wheezing.   Cardiovascular: Negative for chest pain, palpitations and leg swelling.  Gastrointestinal: Negative for abdominal pain, constipation, diarrhea and vomiting.  Endocrine: Negative for polydipsia and polyuria.  Genitourinary: Negative for dysuria, frequency and genital sores.  Musculoskeletal: Positive for arthralgias and back pain (sciatica and generalized OA). Negative for gait problem and joint swelling.  Skin: Negative for color change and rash.  Neurological: Negative for dizziness, tremors, light-headedness and headaches.  Hematological: Negative for adenopathy. Does not bruise/bleed easily.  Psychiatric/Behavioral: Negative for dysphoric mood and sleep disturbance. The patient is not nervous/anxious.     Patient Active Problem List   Diagnosis Date Noted  . 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 PAC (premature atrial contraction) 10/25/2017  . Left-sided low back pain with left-sided sciatica 04/01/2016  . Osteopenia 11/20/2015  . Allergic rhinitis 11/15/2015  . Body mass index (BMI) of 25.0-25.9 in adult 11/15/2015  . Prediabetes 11/15/2015  . Acquired hypothyroidism 10/16/2015  . Mixed hyperlipidemia 06/24/2015    Allergies  Allergen Reactions  . Fosamax [Alendronate] Other (See Comments)    Body aches    Past Surgical History:  Procedure Laterality Date  . BASAL CELL CARCINOMA EXCISION  06/04/11    [facility] (Mohs procedure) left orbital Dr. Shelda Jakes  . BREAST BIOPSY Right 2006   bx/clip-neg  . CATARACT EXTRACTION W/PHACO Right 03/16/2018   Procedure: CATARACT EXTRACTION PHACO AND INTRAOCULAR LENS PLACEMENT ([place]) RIGHT;  Surgeon: Leandrew Koyanagi, MD;  Location: [facility];  Service: Ophthalmology;  Laterality: Right;  requests early  . Cologuard home test  12/2015   Negative  . RETINAL DETACHMENT SURGERY Right 2018     Social History   Tobacco Use  . Smoking status: Never Smoker  . Smokeless tobacco: Never Used  Substance Use Topics  . Alcohol use: No  . Drug use: No     Medication list has been reviewed and updated.  Current Meds  Medication Sig  . Ascorbic Acid (VITAMIN C) 100 MG tablet Take 1 tablet by mouth daily.  Marland Kitchen. aspirin 81 MG tablet Take 1 tablet by mouth daily.  Marland Kitchen. atorvastatin (LIPITOR) 40 MG tablet TAKE 1 TABLET BY MOUTH EVERY DAY  . Calcium Carbonate (CALCIUM 600 PO) Take 1 tablet by mouth daily.  Marland Kitchen. levothyroxine (SYNTHROID) 100 MCG tablet TAKE 1 TABLET(100 MCG) BY MOUTH DAILY  . MULTIPLE VITAMIN PO Take 1 tablet by mouth daily.  . Vitamin D, Cholecalciferol, 1000 UNITS TABS Take 1 tablet by mouth daily.  . Vitamin E 100 UNITS TABS Take 1 capsule by mouth daily.    PHQ 2/9 Scores 10/30/2019 10/27/2018 09/06/2017 10/23/2016  PHQ - 2 Score 1 0 0 0  PHQ- 9 Score 4 - - -    BP Readings from Last 3 Encounters:  10/30/19 120/72  10/27/18 128/66  03/16/18 105/69    Physical Exam Vitals signs and nursing note reviewed.  Constitutional:      General: She is not in acute distress.    Appearance: She is well-developed.  HENT:     Head: Normocephalic and atraumatic.     Right Ear: Tympanic membrane and ear canal normal.     Left Ear: Tympanic membrane and ear canal normal.     Nose:     Right Sinus: No maxillary sinus tenderness.     Left Sinus: No maxillary sinus tenderness.  Eyes:     General: No scleral icterus.       Right eye: No discharge.        Left eye: No discharge.     Conjunctiva/sclera: Conjunctivae normal.  Neck:     Musculoskeletal: Normal range of motion. No erythema.     Thyroid: No thyromegaly.     Vascular: No carotid bruit.  Cardiovascular:     Rate and Rhythm: Normal rate and regular rhythm. Frequent extrasystoles are present.    Pulses:          Dorsalis pedis pulses are 1+ on the right side and 1+ on the left side.       Posterior tibial pulses  are 1+ on the right side and 1+ on the left side.     Heart sounds: Normal heart sounds. No murmur.  Pulmonary:     Effort: Pulmonary effort is normal. No respiratory distress.     Breath sounds: No wheezing.  Chest:     Breasts:        Right: No mass, nipple discharge, skin change or tenderness.        Left: No mass, nipple discharge, skin change or tenderness.  Abdominal:     General: Bowel sounds are normal.     Palpations: Abdomen is soft.     Tenderness: There is no abdominal tenderness.  Musculoskeletal: Normal range of motion.     Right lower leg: No edema.     Left lower leg: No edema.  Lymphadenopathy:     Cervical: No cervical adenopathy.  Skin:    General: Skin is warm and dry.  Findings: No rash.  Neurological:     Mental Status: She is alert and oriented to person, place, and time.     Cranial Nerves: No cranial nerve deficit.     Sensory: No sensory deficit.     Deep Tendon Reflexes: Reflexes are normal and symmetric.  Psychiatric:        Speech: Speech normal.        Behavior: Behavior normal.        Thought Content: Thought content normal.     Wt Readings from Last 3 Encounters:  10/30/19 154 lb (69.9 kg)  10/27/18 156 lb (70.8 kg)  03/16/18 155 lb (70.3 kg)    BP 120/72   Pulse 88   Ht 5\' 6"  (1.676 m)   Wt 154 lb (69.9 kg)   SpO2 97%   BMI 24.86 kg/m   Assessment and Plan: 1. Annual physical exam Normal exam Continue healthy diet, exercise - POCT urinalysis dipstick  2. Encounter for screening mammogram for breast cancer - MM 3D SCREEN BREAST BILATERAL; Future  3. Mixed hyperlipidemia Tolerating statin medication without side effects at this time Continue same therapy without change at this time. - CBC with Differential/Platelet - Comprehensive metabolic panel - Lipid panel  4. Acquired hypothyroidism Supplemented with no symptoms to suggest poor control - TSH + free T4  5. Osteopenia of multiple sites Due for repeat DEXA - last  in 2017 showed osteopenia Continue calcium and vitamin D - DG Bone Density; Future  6. PAC (premature atrial contraction) Patient remains symptomatic with controlled blood pressure No indication for medication at this time   Partially dictated using 2018. Any errors are unintentional.  Animal nutritionist, MD [facility]  10/30/2019

## 2019-10-31 LAB — COMPREHENSIVE METABOLIC PANEL
ALT: 22 IU/L (ref 0–32)
AST: 23 IU/L (ref 0–40)
Albumin/Globulin Ratio: 2.1 (ref 1.2–2.2)
Albumin: 4.8 g/dL — ABNORMAL HIGH (ref 3.7–4.7)
Alkaline Phosphatase: 120 IU/L — ABNORMAL HIGH (ref 39–117)
BUN/Creatinine Ratio: 14 (ref 12–28)
BUN: 13 mg/dL (ref 8–27)
Bilirubin Total: 0.5 mg/dL (ref 0.0–1.2)
CO2: 24 mmol/L (ref 20–29)
Calcium: 9.8 mg/dL (ref 8.7–10.3)
Chloride: 100 mmol/L (ref 96–106)
Creatinine, Ser: 0.91 mg/dL (ref 0.57–1.00)
GFR calc Af Amer: 70 mL/min/{1.73_m2} (ref >59)
GFR calc non Af Amer: 61 mL/min/{1.73_m2} (ref >59)
Globulin, Total: 2.3 g/dL (ref 1.5–4.5)
Glucose: 127 mg/dL — ABNORMAL HIGH (ref 65–99)
Potassium: 4.4 mmol/L (ref 3.5–5.2)
Sodium: 140 mmol/L (ref 134–144)
Total Protein: 7.1 g/dL (ref 6.0–8.5)

## 2019-10-31 LAB — CBC WITH DIFFERENTIAL/PLATELET
Basophils Absolute: 0.1 10*3/uL (ref 0.0–0.2)
Basos: 1 %
EOS (ABSOLUTE): 0.1 10*3/uL (ref 0.0–0.4)
Eos: 2 %
Hematocrit: 41 % (ref 34.0–46.6)
Hemoglobin: 13.6 g/dL (ref 11.1–15.9)
Immature Grans (Abs): 0 10*3/uL (ref 0.0–0.1)
Immature Granulocytes: 0 %
Lymphocytes Absolute: 1.4 10*3/uL (ref 0.7–3.1)
Lymphs: 20 %
MCH: 30.5 pg (ref 26.6–33.0)
MCHC: 33.2 g/dL (ref 31.5–35.7)
MCV: 92 fL (ref 79–97)
Monocytes Absolute: 0.4 10*3/uL (ref 0.1–0.9)
Monocytes: 6 %
Neutrophils Absolute: 5.1 10*3/uL (ref 1.4–7.0)
Neutrophils: 71 %
Platelets: 256 10*3/uL (ref 150–450)
RBC: 4.46 x10E6/uL (ref 3.77–5.28)
RDW: 12.2 % (ref 11.7–15.4)
WBC: 7.1 10*3/uL (ref 3.4–10.8)

## 2019-10-31 LAB — TSH+FREE T4
Free T4: 1.48 ng/dL (ref 0.82–1.77)
TSH: 2.67 u[IU]/mL (ref 0.450–4.500)

## 2019-10-31 LAB — LIPID PANEL
Chol/HDL Ratio: 2.1 ratio (ref 0.0–4.4)
Cholesterol, Total: 192 mg/dL (ref 100–199)
HDL: 91 mg/dL (ref >39)
LDL Chol Calc (NIH): 74 mg/dL (ref 0–99)
Triglycerides: 162 mg/dL — ABNORMAL HIGH (ref 0–149)
VLDL Cholesterol Cal: 27 mg/dL (ref 5–40)

## 2019-11-29 DIAGNOSIS — M955 Acquired deformity of pelvis: Secondary | ICD-10-CM | POA: Diagnosis not present

## 2019-11-29 DIAGNOSIS — M9902 Segmental and somatic dysfunction of thoracic region: Secondary | ICD-10-CM | POA: Diagnosis not present

## 2019-11-29 DIAGNOSIS — M9905 Segmental and somatic dysfunction of pelvic region: Secondary | ICD-10-CM | POA: Diagnosis not present

## 2019-11-29 DIAGNOSIS — M9903 Segmental and somatic dysfunction of lumbar region: Secondary | ICD-10-CM | POA: Diagnosis not present

## 2019-11-29 DIAGNOSIS — M5442 Lumbago with sciatica, left side: Secondary | ICD-10-CM | POA: Diagnosis not present

## 2020-01-03 DIAGNOSIS — M9905 Segmental and somatic dysfunction of pelvic region: Secondary | ICD-10-CM | POA: Diagnosis not present

## 2020-01-03 DIAGNOSIS — M9903 Segmental and somatic dysfunction of lumbar region: Secondary | ICD-10-CM | POA: Diagnosis not present

## 2020-01-03 DIAGNOSIS — M9902 Segmental and somatic dysfunction of thoracic region: Secondary | ICD-10-CM | POA: Diagnosis not present

## 2020-01-03 DIAGNOSIS — M955 Acquired deformity of pelvis: Secondary | ICD-10-CM | POA: Diagnosis not present

## 2020-01-03 DIAGNOSIS — M5442 Lumbago with sciatica, left side: Secondary | ICD-10-CM | POA: Diagnosis not present

## 2020-01-11 ENCOUNTER — Ambulatory Visit: Payer: Medicare Other

## 2020-01-11 ENCOUNTER — Inpatient Hospital Stay: Admission: RE | Admit: 2020-01-11 | Payer: Medicare Other | Source: Ambulatory Visit

## 2020-01-17 ENCOUNTER — Ambulatory Visit
Admission: RE | Admit: 2020-01-17 | Discharge: 2020-01-17 | Disposition: A | Discharge status: Home / Self Care | Payer: Medicare Other | Source: Ambulatory Visit | Attending: Internal Medicine | Admitting: Internal Medicine

## 2020-01-17 ENCOUNTER — Other Ambulatory Visit: Payer: Self-pay

## 2020-01-17 DIAGNOSIS — M8589 Other specified disorders of bone density and structure, multiple sites: Secondary | ICD-10-CM

## 2020-01-17 DIAGNOSIS — Z1231 Encounter for screening mammogram for malignant neoplasm of breast: Secondary | ICD-10-CM

## 2020-01-23 DIAGNOSIS — M955 Acquired deformity of pelvis: Secondary | ICD-10-CM | POA: Diagnosis not present

## 2020-01-23 DIAGNOSIS — M9902 Segmental and somatic dysfunction of thoracic region: Secondary | ICD-10-CM | POA: Diagnosis not present

## 2020-01-23 DIAGNOSIS — M9903 Segmental and somatic dysfunction of lumbar region: Secondary | ICD-10-CM | POA: Diagnosis not present

## 2020-01-23 DIAGNOSIS — M5442 Lumbago with sciatica, left side: Secondary | ICD-10-CM | POA: Diagnosis not present

## 2020-01-23 DIAGNOSIS — M9905 Segmental and somatic dysfunction of pelvic region: Secondary | ICD-10-CM | POA: Diagnosis not present

## 2020-02-06 DIAGNOSIS — M9905 Segmental and somatic dysfunction of pelvic region: Secondary | ICD-10-CM | POA: Diagnosis not present

## 2020-02-06 DIAGNOSIS — M5442 Lumbago with sciatica, left side: Secondary | ICD-10-CM | POA: Diagnosis not present

## 2020-02-06 DIAGNOSIS — M9903 Segmental and somatic dysfunction of lumbar region: Secondary | ICD-10-CM | POA: Diagnosis not present

## 2020-02-06 DIAGNOSIS — M9902 Segmental and somatic dysfunction of thoracic region: Secondary | ICD-10-CM | POA: Diagnosis not present

## 2020-02-06 DIAGNOSIS — M955 Acquired deformity of pelvis: Secondary | ICD-10-CM | POA: Diagnosis not present

## 2020-02-07 ENCOUNTER — Other Ambulatory Visit: Payer: Self-pay

## 2020-02-07 ENCOUNTER — Ambulatory Visit (INDEPENDENT_AMBULATORY_CARE_PROVIDER_SITE_OTHER): Payer: Medicare Other | Admitting: Internal Medicine

## 2020-02-07 ENCOUNTER — Encounter: Payer: Self-pay | Admitting: Internal Medicine

## 2020-02-07 VITALS — BP 135/84 | HR 81 | Temp 96.8°F | Ht 66.0 in | Wt 160.0 lb

## 2020-02-07 DIAGNOSIS — S60222A Contusion of left hand, initial encounter: Secondary | ICD-10-CM | POA: Diagnosis not present

## 2020-02-07 DIAGNOSIS — M170 Bilateral primary osteoarthritis of knee: Secondary | ICD-10-CM

## 2020-02-07 NOTE — Progress Notes (Signed)
Date:  02/07/2020   Name:  Kelsey Pacheco   DOB:  [birth year]   MRN:  [id number]   Chief Complaint: bruise (Left hand. 5 weeks ago a large watch pinched trhe top of her hand and it is now swollena dn bruised. Not going away. )  HPI Patient noted onset of bruising about 4 weeks ago when her watch face pinched the back of her hand.  There was immediate bruising that involved the entire dorsum of her hand and extended around to the ventral aspect of her wrist. Most of the bruising has resolved - still some on the side of her hand but a large mass of the dorsum.  The mass is slowly decreasing in size.  There is no pain or limitation to use of her hand.  She does take aspirin 81 mg daily.  Lab Results  Component Value Date   CREATININE 0.91 10/30/2019   BUN 13 10/30/2019   NA 140 10/30/2019   K 4.4 10/30/2019   CL 100 10/30/2019   CO2 24 10/30/2019   Lab Results  Component Value Date   CHOL 192 10/30/2019   HDL 91 10/30/2019   LDLCALC 74 10/30/2019   TRIG 162 (H) 10/30/2019   CHOLHDL 2.1 10/30/2019   Lab Results  Component Value Date   TSH 2.670 10/30/2019   Lab Results  Component Value Date   HGBA1C 6.4 (H) 10/27/2018   Lab Results  Component Value Date   WBC 7.1 10/30/2019   HGB 13.6 10/30/2019   HCT 41.0 10/30/2019   MCV 92 10/30/2019   PLT 256 10/30/2019   Lab Results  Component Value Date   ALT 22 10/30/2019   AST 23 10/30/2019   ALKPHOS 120 (H) 10/30/2019   BILITOT 0.5 10/30/2019     Review of Systems  Constitutional: Negative for fever.  Respiratory: Negative for chest tightness and shortness of breath.   Cardiovascular: Negative for chest pain.  Musculoskeletal: Positive for arthralgias (knee pain from OA).  Skin: Positive for color change. Negative for wound.  Neurological: Negative for weakness.    Patient Active Problem List   Diagnosis Date Noted  . 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 PAC (premature atrial contraction) 10/25/2017  .  Left-sided low back pain with left-sided sciatica 04/01/2016  . Osteopenia 11/20/2015  . Allergic rhinitis 11/15/2015  . Body mass index (BMI) of 25.0-25.9 in adult 11/15/2015  . Prediabetes 11/15/2015  . Acquired hypothyroidism 10/16/2015  . Mixed hyperlipidemia 06/24/2015    Allergies  Allergen Reactions  . Fosamax [Alendronate] Other (See Comments)    Body aches    Past Surgical History:  Procedure Laterality Date  . BASAL CELL CARCINOMA EXCISION  06/04/11    [facility] (Mohs procedure) left orbital Dr. Shelda Jakes  . BREAST BIOPSY Right 2006   bx/clip-neg  . CATARACT EXTRACTION W/PHACO Right 03/16/2018   Procedure: CATARACT EXTRACTION PHACO AND INTRAOCULAR LENS PLACEMENT ([place]) RIGHT;  Surgeon: Leandrew Koyanagi, MD;  Location: [facility];  Service: Ophthalmology;  Laterality: Right;  requests early  . Cologuard home test  12/2015   Negative  . RETINAL DETACHMENT SURGERY Right 2018    Social History   Tobacco Use  . Smoking status: Never Smoker  . Smokeless tobacco: Never Used  Substance Use Topics  . Alcohol use: No  . Drug use: No     Medication list has been reviewed and updated.  Current Meds  Medication Sig  . Ascorbic Acid (VITAMIN C) 100 MG tablet Take  1 tablet by mouth daily.  Marland Kitchen aspirin 81 MG tablet Take 1 tablet by mouth daily.  Marland Kitchen atorvastatin (LIPITOR) 40 MG tablet TAKE 1 TABLET BY MOUTH EVERY DAY  . Calcium Carbonate (CALCIUM 600 PO) Take 1 tablet by mouth daily.  Marland Kitchen levothyroxine (SYNTHROID) 100 MCG tablet TAKE 1 TABLET(100 MCG) BY MOUTH DAILY  . MULTIPLE VITAMIN PO Take 1 tablet by mouth daily.  . Vitamin D, Cholecalciferol, 1000 UNITS TABS Take 1 tablet by mouth daily.  . Vitamin E 100 UNITS TABS Take 1 capsule by mouth daily.    PHQ 2/9 Scores 02/07/2020 10/30/2019 10/27/2018 09/06/2017  PHQ - 2 Score 0 1 0 0  PHQ- 9 Score - 4 - -    BP Readings from Last 3 Encounters:  02/07/20 135/84  10/30/19 120/72  10/27/18 128/66    Physical  Exam Vitals and nursing note reviewed.  Constitutional:      General: She is not in acute distress.    Appearance: Normal appearance. She is well-developed.  HENT:     Head: Normocephalic and atraumatic.  Cardiovascular:     Rate and Rhythm: Normal rate and regular rhythm.  Pulmonary:     Effort: Pulmonary effort is normal. No respiratory distress.  Musculoskeletal:        General: Normal range of motion.  Skin:    General: Skin is warm and dry.     Findings: Ecchymosis present. No rash.     Comments: Bruising on lateral aspect of left wrist  Large consolidated hematoma on dorsum of hand.  Not painful or warm.  Neurological:     Mental Status: She is alert and oriented to person, place, and time.  Psychiatric:        Behavior: Behavior normal.        Thought Content: Thought content normal.       Wt Readings from Last 3 Encounters:  02/07/20 160 lb (72.6 kg)  10/30/19 154 lb (69.9 kg)  10/27/18 156 lb (70.8 kg)    BP 135/84   Pulse 81   Temp (!) 96.8 F (36 C) (Temporal)   Ht 5\' 6"  (1.676 m)   Wt 160 lb (72.6 kg)   SpO2 100%   BMI 25.82 kg/m   Assessment and Plan: 1. Traumatic hematoma of left hand, initial encounter Stop aspirin therapy for the next month Use warm compresses to encourage resorption No worrisome features - expect this will take several more months to resolve Return if needed  2. Primary osteoarthritis of both knees I recommend Tylenol as needed Topical rubs can be used as well  Partially dictated using . Any errors are unintentional.  Animal nutritionist, MD [facility]  02/07/2020

## 2020-02-15 ENCOUNTER — Other Ambulatory Visit: Payer: Self-pay | Admitting: Internal Medicine

## 2020-02-20 DIAGNOSIS — M9903 Segmental and somatic dysfunction of lumbar region: Secondary | ICD-10-CM | POA: Diagnosis not present

## 2020-02-20 DIAGNOSIS — M9902 Segmental and somatic dysfunction of thoracic region: Secondary | ICD-10-CM | POA: Diagnosis not present

## 2020-02-20 DIAGNOSIS — M5442 Lumbago with sciatica, left side: Secondary | ICD-10-CM | POA: Diagnosis not present

## 2020-02-20 DIAGNOSIS — M955 Acquired deformity of pelvis: Secondary | ICD-10-CM | POA: Diagnosis not present

## 2020-02-20 DIAGNOSIS — M9905 Segmental and somatic dysfunction of pelvic region: Secondary | ICD-10-CM | POA: Diagnosis not present

## 2020-03-05 DIAGNOSIS — M9905 Segmental and somatic dysfunction of pelvic region: Secondary | ICD-10-CM | POA: Diagnosis not present

## 2020-03-05 DIAGNOSIS — M955 Acquired deformity of pelvis: Secondary | ICD-10-CM | POA: Diagnosis not present

## 2020-03-05 DIAGNOSIS — M5442 Lumbago with sciatica, left side: Secondary | ICD-10-CM | POA: Diagnosis not present

## 2020-03-05 DIAGNOSIS — M9902 Segmental and somatic dysfunction of thoracic region: Secondary | ICD-10-CM | POA: Diagnosis not present

## 2020-03-05 DIAGNOSIS — M9903 Segmental and somatic dysfunction of lumbar region: Secondary | ICD-10-CM | POA: Diagnosis not present

## 2020-03-19 DIAGNOSIS — M5442 Lumbago with sciatica, left side: Secondary | ICD-10-CM | POA: Diagnosis not present

## 2020-03-19 DIAGNOSIS — M955 Acquired deformity of pelvis: Secondary | ICD-10-CM | POA: Diagnosis not present

## 2020-03-19 DIAGNOSIS — M9902 Segmental and somatic dysfunction of thoracic region: Secondary | ICD-10-CM | POA: Diagnosis not present

## 2020-03-19 DIAGNOSIS — M9903 Segmental and somatic dysfunction of lumbar region: Secondary | ICD-10-CM | POA: Diagnosis not present

## 2020-03-19 DIAGNOSIS — M9905 Segmental and somatic dysfunction of pelvic region: Secondary | ICD-10-CM | POA: Diagnosis not present

## 2020-04-02 DIAGNOSIS — M5442 Lumbago with sciatica, left side: Secondary | ICD-10-CM | POA: Diagnosis not present

## 2020-04-02 DIAGNOSIS — M9903 Segmental and somatic dysfunction of lumbar region: Secondary | ICD-10-CM | POA: Diagnosis not present

## 2020-04-02 DIAGNOSIS — M955 Acquired deformity of pelvis: Secondary | ICD-10-CM | POA: Diagnosis not present

## 2020-04-02 DIAGNOSIS — M9902 Segmental and somatic dysfunction of thoracic region: Secondary | ICD-10-CM | POA: Diagnosis not present

## 2020-04-02 DIAGNOSIS — M9905 Segmental and somatic dysfunction of pelvic region: Secondary | ICD-10-CM | POA: Diagnosis not present

## 2020-04-02 IMAGING — MG DIGITAL SCREENING BILAT W/ TOMO W/ CAD
8 series · 8 of 24 positions shown · non-contrast
Comparison: Previous exam(s).

CLINICAL DATA: Screening.

EXAM:
DIGITAL SCREENING BILATERAL MAMMOGRAM WITH TOMO AND CAD

[R MLO synth-2D]
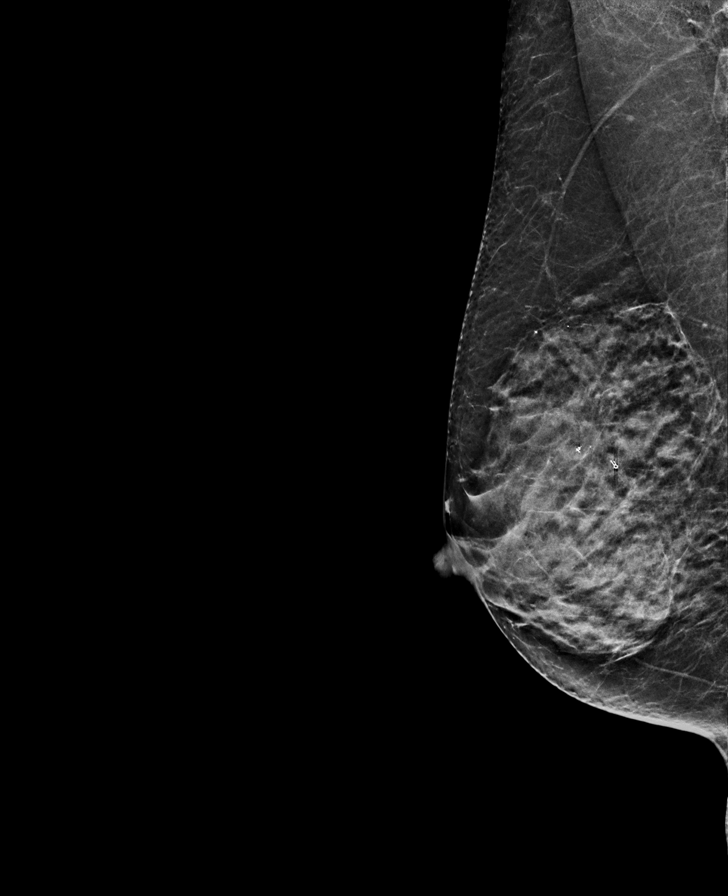

[L CC synth-2D]
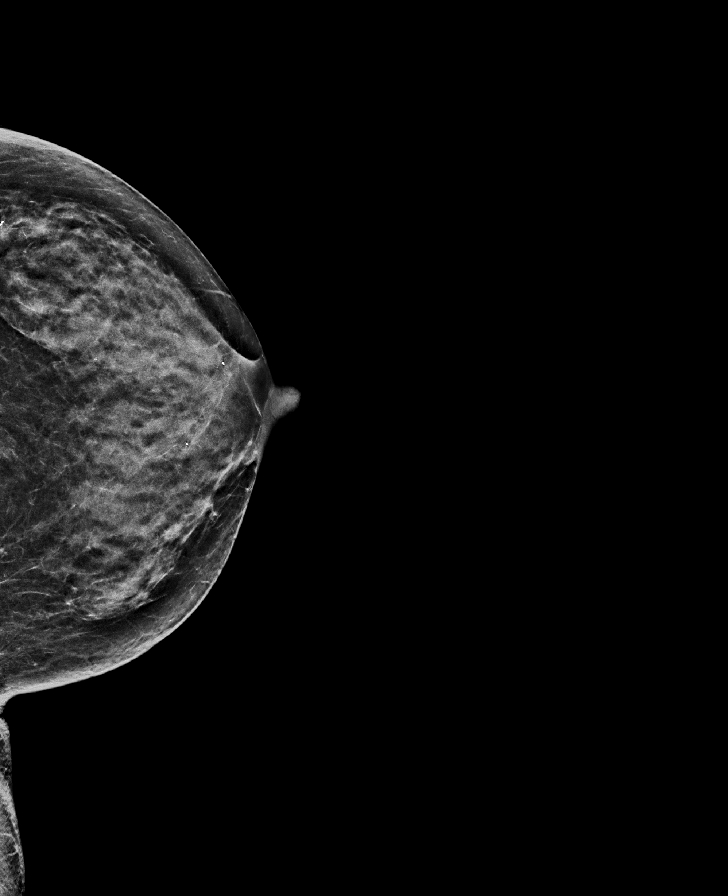

[L MLO synth-2D]
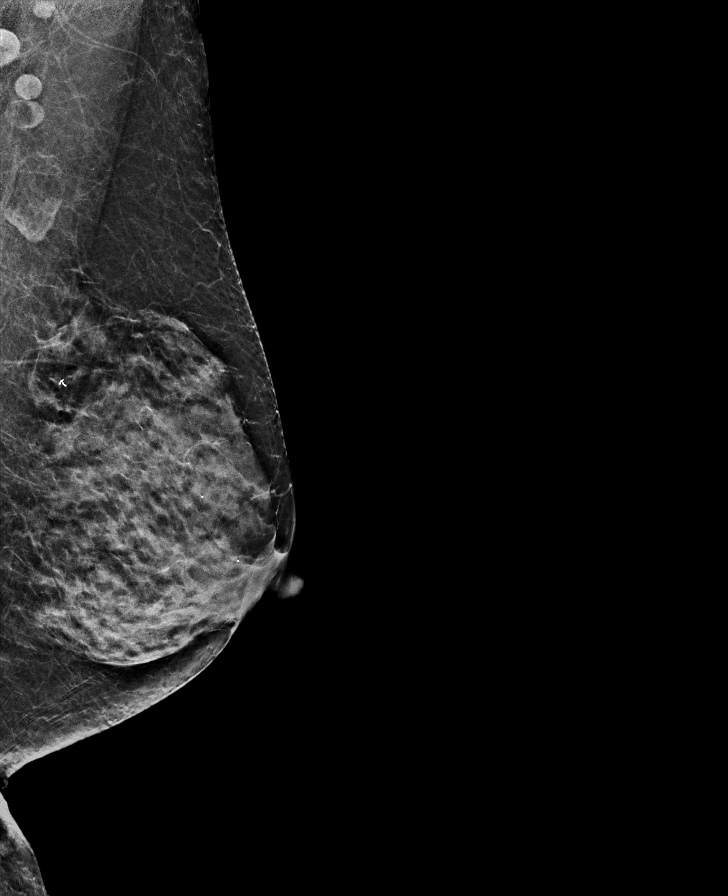

[R CC synth-2D]
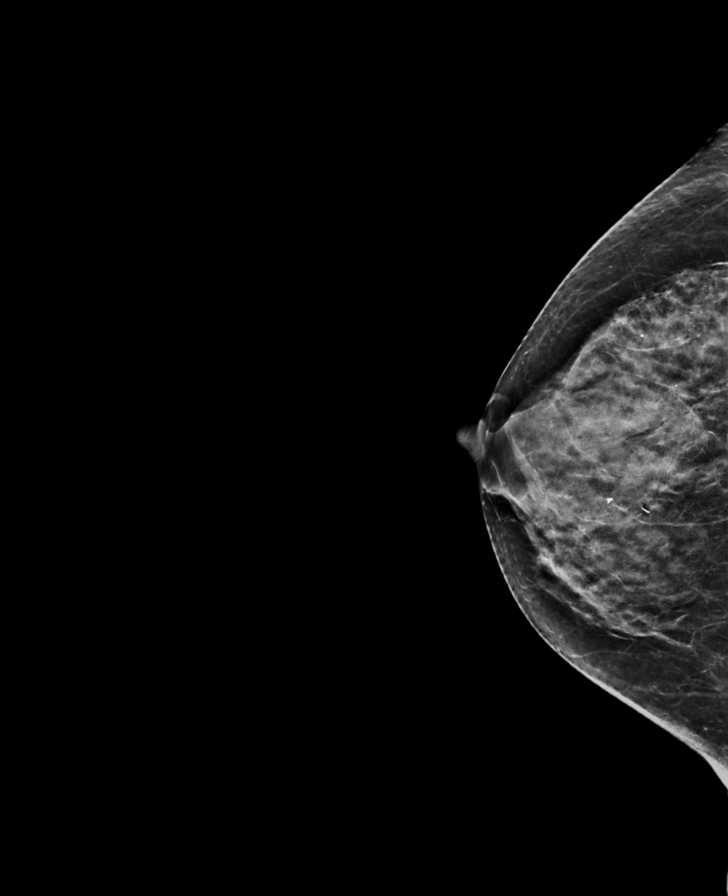

[L MLO tomo · tomo slice 28/55.0]
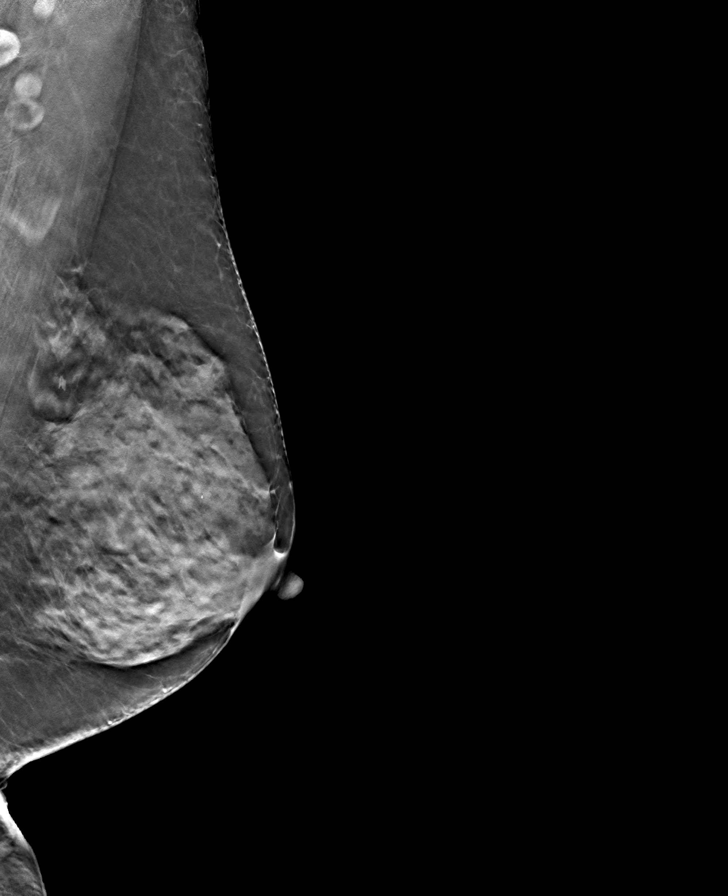

[R MLO tomo · tomo slice 27/53.0]
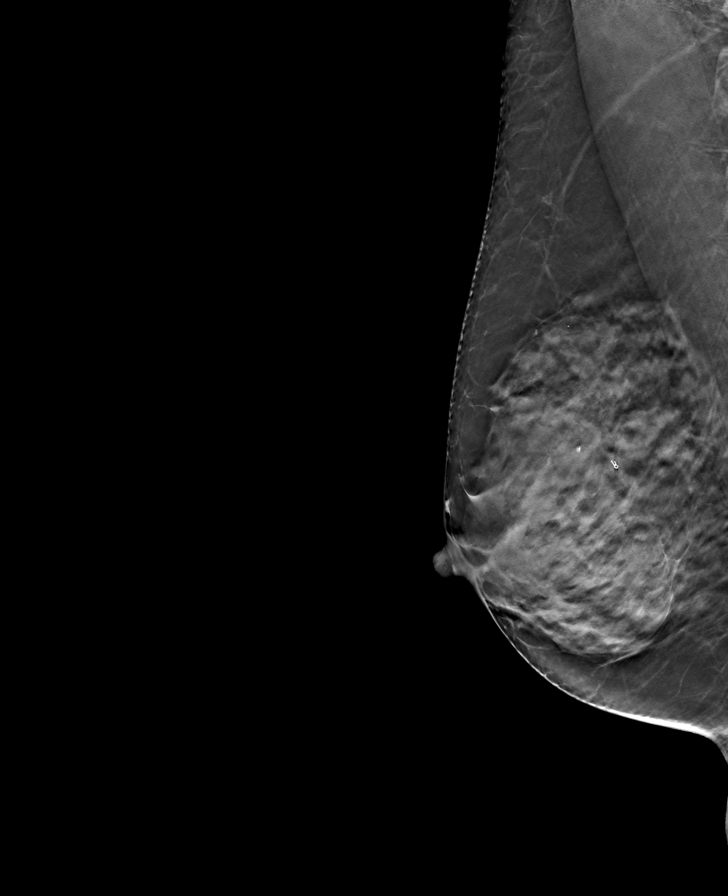

[R CC tomo · tomo slice 29/58.0]
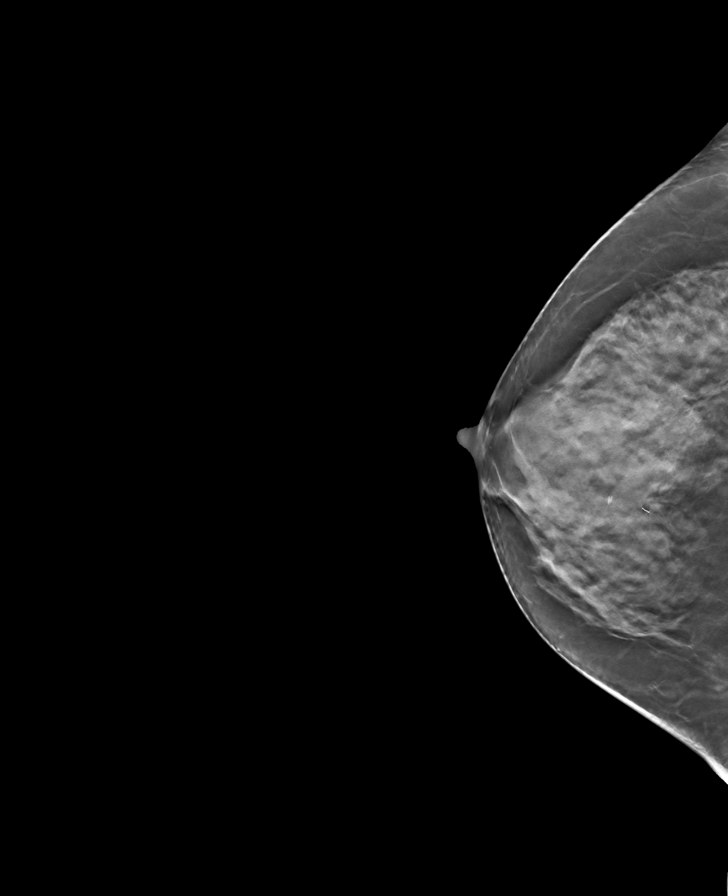

[L CC tomo · tomo slice 25/50.0]
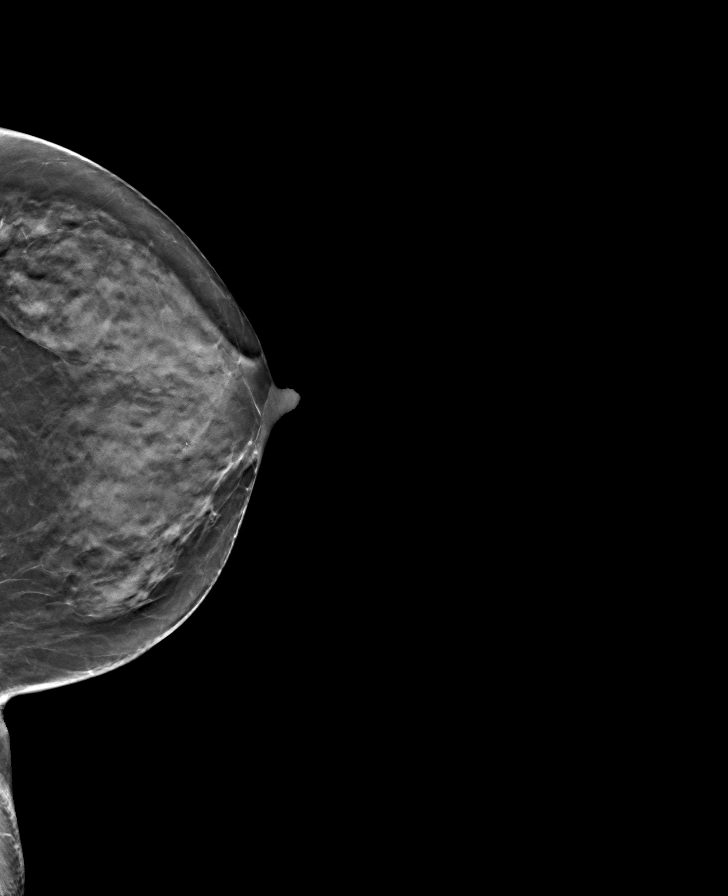

[8 of 24 positions shown; findings below may reference images not displayed]

ACR Breast Density Category d: The breast tissue is extremely dense,
which lowers the sensitivity of mammography
FINDINGS: There are no findings suspicious for malignancy. Images were
processed with CAD.
IMPRESSION: No mammographic evidence of malignancy. A result letter of this
screening mammogram will be mailed directly to the patient.

RECOMMENDATION:
Screening mammogram in one year. (Code:[id number])

BI-RADS CATEGORY  1: Negative.

## 2020-04-16 DIAGNOSIS — M9902 Segmental and somatic dysfunction of thoracic region: Secondary | ICD-10-CM | POA: Diagnosis not present

## 2020-04-16 DIAGNOSIS — M5442 Lumbago with sciatica, left side: Secondary | ICD-10-CM | POA: Diagnosis not present

## 2020-04-16 DIAGNOSIS — M955 Acquired deformity of pelvis: Secondary | ICD-10-CM | POA: Diagnosis not present

## 2020-04-16 DIAGNOSIS — M9903 Segmental and somatic dysfunction of lumbar region: Secondary | ICD-10-CM | POA: Diagnosis not present

## 2020-04-16 DIAGNOSIS — M9905 Segmental and somatic dysfunction of pelvic region: Secondary | ICD-10-CM | POA: Diagnosis not present

## 2020-04-30 DIAGNOSIS — M5442 Lumbago with sciatica, left side: Secondary | ICD-10-CM | POA: Diagnosis not present

## 2020-04-30 DIAGNOSIS — M955 Acquired deformity of pelvis: Secondary | ICD-10-CM | POA: Diagnosis not present

## 2020-04-30 DIAGNOSIS — M9905 Segmental and somatic dysfunction of pelvic region: Secondary | ICD-10-CM | POA: Diagnosis not present

## 2020-04-30 DIAGNOSIS — M9903 Segmental and somatic dysfunction of lumbar region: Secondary | ICD-10-CM | POA: Diagnosis not present

## 2020-04-30 DIAGNOSIS — M9902 Segmental and somatic dysfunction of thoracic region: Secondary | ICD-10-CM | POA: Diagnosis not present

## 2020-05-15 DIAGNOSIS — M5442 Lumbago with sciatica, left side: Secondary | ICD-10-CM | POA: Diagnosis not present

## 2020-05-15 DIAGNOSIS — M9905 Segmental and somatic dysfunction of pelvic region: Secondary | ICD-10-CM | POA: Diagnosis not present

## 2020-05-15 DIAGNOSIS — M955 Acquired deformity of pelvis: Secondary | ICD-10-CM | POA: Diagnosis not present

## 2020-05-15 DIAGNOSIS — M9903 Segmental and somatic dysfunction of lumbar region: Secondary | ICD-10-CM | POA: Diagnosis not present

## 2020-05-15 DIAGNOSIS — M9902 Segmental and somatic dysfunction of thoracic region: Secondary | ICD-10-CM | POA: Diagnosis not present

## 2020-06-05 DIAGNOSIS — M9905 Segmental and somatic dysfunction of pelvic region: Secondary | ICD-10-CM | POA: Diagnosis not present

## 2020-06-05 DIAGNOSIS — M955 Acquired deformity of pelvis: Secondary | ICD-10-CM | POA: Diagnosis not present

## 2020-06-05 DIAGNOSIS — M5442 Lumbago with sciatica, left side: Secondary | ICD-10-CM | POA: Diagnosis not present

## 2020-06-05 DIAGNOSIS — M9902 Segmental and somatic dysfunction of thoracic region: Secondary | ICD-10-CM | POA: Diagnosis not present

## 2020-06-05 DIAGNOSIS — M9903 Segmental and somatic dysfunction of lumbar region: Secondary | ICD-10-CM | POA: Diagnosis not present

## 2020-07-26 ENCOUNTER — Other Ambulatory Visit: Payer: Self-pay | Admitting: Internal Medicine

## 2020-07-26 DIAGNOSIS — E039 Hypothyroidism, unspecified: Secondary | ICD-10-CM

## 2020-08-05 ENCOUNTER — Ambulatory Visit (INDEPENDENT_AMBULATORY_CARE_PROVIDER_SITE_OTHER): Payer: Medicare Other

## 2020-08-05 DIAGNOSIS — Z0000 Encounter for general adult medical examination without abnormal findings: Secondary | ICD-10-CM

## 2020-08-05 NOTE — Patient Instructions (Signed)
Kelsey Pacheco , Thank you for taking time to come for your Medicare Wellness Visit. I appreciate your ongoing commitment to your health goals. Please review the following plan we discussed and let me know if I can assist you in the future.   Screening recommendations/referrals: Colonoscopy: no longer required Mammogram: done 01/17/20 Bone Density: done 01/17/20 Recommended yearly ophthalmology/optometry visit for glaucoma screening and checkup Recommended yearly dental visit for hygiene and checkup  Vaccinations: Influenza vaccine: due Pneumococcal vaccine: done 04/23/14 Tdap vaccine: done 08/29/11 Shingles vaccine: Shingrix discussed. Please contact your pharmacy for coverage information.  Covid-19: done 01/06/20 & 02/04/20  Conditions/risks identified: Recommend increasing physical activity to 150 minutes per week  Next appointment: Follow up in one year for your annual wellness visit    Preventive Care 79 Years and Older, Female Preventive care refers to lifestyle choices and visits with your health care provider that can promote health and wellness. What does preventive care include?  A yearly physical exam. This is also called an annual well check.  Dental exams once or twice a year.  Routine eye exams. Ask your health care provider how often you should have your eyes checked.  Personal lifestyle choices, including:  Daily care of your teeth and gums.  Regular physical activity.  Eating a healthy diet.  Avoiding tobacco and drug use.  Limiting alcohol use.  Practicing safe sex.  Taking low-dose aspirin every day.  Taking vitamin and mineral supplements as recommended by your health care provider. What happens during an annual well check? The services and screenings done by your health care provider during your annual well check will depend on your age, overall health, lifestyle risk factors, and family history of disease. Counseling  Your health care provider may ask you  questions about your:  Alcohol use.  Tobacco use.  Drug use.  Emotional well-being.  Home and relationship well-being.  Sexual activity.  Eating habits.  History of falls.  Memory and ability to understand (cognition).  Work and work Astronomer.  Reproductive health. Screening  You may have the following tests or measurements:  Height, weight, and BMI.  Blood pressure.  Lipid and cholesterol levels. These may be checked every 5 years, or more frequently if you are over 67 years old.  Skin check.  Lung cancer screening. You may have this screening every year starting at age 21 if you have a 30-pack-year history of smoking and currently smoke or have quit within the past 15 years.  Fecal occult blood test (FOBT) of the stool. You may have this test every year starting at age 53.  Flexible sigmoidoscopy or colonoscopy. You may have a sigmoidoscopy every 5 years or a colonoscopy every 10 years starting at age 56.  Hepatitis C blood test.  Hepatitis B blood test.  Sexually transmitted disease (STD) testing.  Diabetes screening. This is done by checking your blood sugar (glucose) after you have not eaten for a while (fasting). You may have this done every 1-3 years.  Bone density scan. This is done to screen for osteoporosis. You may have this done starting at age 65.  Mammogram. This may be done every 1-2 years. Talk to your health care provider about how often you should have regular mammograms. Talk with your health care provider about your test results, treatment options, and if necessary, the need for more tests. Vaccines  Your health care provider may recommend certain vaccines, such as:  Influenza vaccine. This is recommended every year.  Tetanus, diphtheria,  and acellular pertussis (Tdap, Td) vaccine. You may need a Td booster every 10 years.  Zoster vaccine. You may need this after age 34.  Pneumococcal 13-valent conjugate (PCV13) vaccine. One dose is  recommended after age 61.  Pneumococcal polysaccharide (PPSV23) vaccine. One dose is recommended after age 37. Talk to your health care provider about which screenings and vaccines you need and how often you need them. This information is not intended to replace advice given to you by your health care provider. Make sure you discuss any questions you have with your health care provider. Document Released: 12/06/2015 Document Revised: 07/29/2016 Document Reviewed: 09/10/2015 Elsevier Interactive Patient Education  2017 [place] Prevention in the Home Falls can cause injuries. They can happen to people of all ages. There are many things you can do to make your home safe and to help prevent falls. What can I do on the outside of my home?  Regularly fix the edges of walkways and driveways and fix any cracks.  Remove anything that might make you trip as you walk through a door, such as a raised step or threshold.  Trim any bushes or trees on the path to your home.  Use bright outdoor lighting.  Clear any walking paths of anything that might make someone trip, such as rocks or tools.  Regularly check to see if handrails are loose or broken. Make sure that both sides of any steps have handrails.  Any raised decks and porches should have guardrails on the edges.  Have any leaves, snow, or ice cleared regularly.  Use sand or salt on walking paths during winter.  Clean up any spills in your garage right away. This includes oil or grease spills. What can I do in the bathroom?  Use night lights.  Install grab bars by the toilet and in the tub and shower. Do not use towel bars as grab bars.  Use non-skid mats or decals in the tub or shower.  If you need to sit down in the shower, use a plastic, non-slip stool.  Keep the floor dry. Clean up any water that spills on the floor as soon as it happens.  Remove soap buildup in the tub or shower regularly.  Attach bath mats  securely with double-sided non-slip rug tape.  Do not have throw rugs and other things on the floor that can make you trip. What can I do in the bedroom?  Use night lights.  Make sure that you have a light by your bed that is easy to reach.  Do not use any sheets or blankets that are too big for your bed. They should not hang down onto the floor.  Have a firm chair that has side arms. You can use this for support while you get dressed.  Do not have throw rugs and other things on the floor that can make you trip. What can I do in the kitchen?  Clean up any spills right away.  Avoid walking on wet floors.  Keep items that you use a lot in easy-to-reach places.  If you need to reach something above you, use a strong step stool that has a grab bar.  Keep electrical cords out of the way.  Do not use floor polish or wax that makes floors slippery. If you must use wax, use non-skid floor wax.  Do not have throw rugs and other things on the floor that can make you trip. What can I do with my  stairs?  Do not leave any items on the stairs.  Make sure that there are handrails on both sides of the stairs and use them. Fix handrails that are broken or loose. Make sure that handrails are as long as the stairways.  Check any carpeting to make sure that it is firmly attached to the stairs. Fix any carpet that is loose or worn.  Avoid having throw rugs at the top or bottom of the stairs. If you do have throw rugs, attach them to the floor with carpet tape.  Make sure that you have a light switch at the top of the stairs and the bottom of the stairs. If you do not have them, ask someone to add them for you. What else can I do to help prevent falls?  Wear shoes that:  Do not have high heels.  Have rubber bottoms.  Are comfortable and fit you well.  Are closed at the toe. Do not wear sandals.  If you use a stepladder:  Make sure that it is fully opened. Do not climb a closed  stepladder.  Make sure that both sides of the stepladder are locked into place.  Ask someone to hold it for you, if possible.  Clearly mark and make sure that you can see:  Any grab bars or handrails.  First and last steps.  Where the edge of each step is.  Use tools that help you move around (mobility aids) if they are needed. These include:  Canes.  Walkers.  Scooters.  Crutches.  Turn on the lights when you go into a dark area. Replace any light bulbs as soon as they burn out.  Set up your furniture so you have a clear path. Avoid moving your furniture around.  If any of your floors are uneven, fix them.  If there are any pets around you, be aware of where they are.  Review your medicines with your doctor. Some medicines can make you feel dizzy. This can increase your chance of falling. Ask your doctor what other things that you can do to help prevent falls. This information is not intended to replace advice given to you by your health care provider. Make sure you discuss any questions you have with your health care provider. Document Released: 09/05/2009 Document Revised: 04/16/2016 Document Reviewed: 12/14/2014 Elsevier Interactive Patient Education  2017 [employer].

## 2020-08-05 NOTE — Progress Notes (Signed)
Subjective:   Kelsey Pacheco is a 79 y.o. female who presents for Medicare Annual (Subsequent) preventive examination.  Virtual Visit via Telephone Note  I connected with  Kelsey Pacheco on 08/05/20 at  1:20 PM EDT by telephone and verified that I am speaking with the correct person using two identifiers.  Medicare Annual Wellness visit completed telephonically due to Covid-19 pandemic.   Location: Patient: home Provider: [facility]   I discussed the limitations, risks, security and privacy concerns of performing an evaluation and management service by telephone and the availability of in person appointments. The patient expressed understanding and agreed to proceed.  Unable to perform video visit due to video visit attempted and failed and/or patient does not have video capability.   Some vital signs may be absent or patient reported.   Kelsey Littler, LPN    Review of Systems     Cardiac Risk Factors include: advanced age (>59men, >38 women);dyslipidemia     Objective:    There were no vitals filed for this visit. There is no height or weight on file to calculate BMI.  Advanced Directives 08/05/2020 03/16/2018 09/06/2017 11/20/2015  Does Patient Have a Medical Advance Directive? Yes Yes Yes Yes  Type of Estate agent of [place];Living will Healthcare Power of Kelsey Pacheco;Living will Healthcare Power of Kelsey Pacheco;Living will Living will;Healthcare Power of Attorney  Copy of Healthcare Power of Attorney in Chart? Yes - validated most recent copy scanned in chart (See row information) Yes No - copy requested -    Current Medications (verified) Outpatient Encounter Medications as of 08/05/2020  Medication Sig  . Ascorbic Acid (VITAMIN C) 100 MG tablet Take 1 tablet by mouth daily.  Marland Kitchen aspirin 81 MG tablet Take 1 tablet by mouth daily.  Marland Kitchen atorvastatin (LIPITOR) 40 MG tablet TAKE 1 TABLET BY MOUTH EVERY DAY  . Calcium Carbonate (CALCIUM 600 PO) Take 1  tablet by mouth daily.  Marland Kitchen levothyroxine (SYNTHROID) 100 MCG tablet TAKE 1 TABLET(100 MCG) BY MOUTH DAILY  . MULTIPLE VITAMIN PO Take 1 tablet by mouth daily.  . Vitamin D, Cholecalciferol, 1000 UNITS TABS Take 1 tablet by mouth daily.  . Vitamin E 100 UNITS TABS Take 1 capsule by mouth daily.   No facility-administered encounter medications on file as of 08/05/2020.    Allergies (verified) Fosamax [alendronate]   History: Past Medical History:  Diagnosis Date  . Arthritis    lower back  . Hyperlipidemia   . Hypothyroidism   . Osteoporosis   . Sciatica of left side   . Thyroid disease   . Vitamin D deficiency    Past Surgical History:  Procedure Laterality Date  . BASAL CELL CARCINOMA EXCISION  06/04/11    [facility] (Mohs procedure) left orbital Dr. Cheryll Pacheco  . BREAST BIOPSY Right 2006   bx/clip-neg  . CATARACT EXTRACTION W/PHACO Right 03/16/2018   Procedure: CATARACT EXTRACTION PHACO AND INTRAOCULAR LENS PLACEMENT (IOC) RIGHT;  Surgeon: Kelsey Mola, MD;  Location: [facility];  Service: Ophthalmology;  Laterality: Right;  requests early  . Cologuard home test  12/2015   Negative  . RETINAL DETACHMENT SURGERY Right 2018   Family History  Problem Relation Age of Onset  . Dementia Father   . Heart disease Father   . Heart attack Mother   . Breast cancer Neg Hx    Social History   Socioeconomic History  . Marital status: Widowed    Spouse name: Not on file  . Number of children:  1  . Years of education: HS  . Highest education level: Not on file  Occupational History    Comment: RETIRED  Tobacco Use  . Smoking status: Never Smoker  . Smokeless tobacco: Never Used  Vaping Use  . Vaping Use: Never used  Substance and Sexual Activity  . Alcohol use: No  . Drug use: No  . Sexual activity: Never  Other Topics Concern  . Not on file  Social History Narrative  . Not on file   Social Determinants of Health   Financial Resource Strain: Low Risk   .  Difficulty of Paying Living Expenses: Not hard at all  Food Insecurity: No Food Insecurity  . Worried About Programme researcher, broadcasting/film/video in the Last Year: Never true  . Ran Out of Food in the Last Year: Never true  Transportation Needs: No Transportation Needs  . Lack of Transportation (Medical): No  . Lack of Transportation (Non-Medical): No  Physical Activity: Insufficiently Active  . Days of Exercise per Week: 2 days  . Minutes of Exercise per Session: 20 min  Stress: No Stress Concern Present  . Feeling of Stress : Not at all  Social Connections: Socially Isolated  . Frequency of Communication with Friends and Family: More than three times a week  . Frequency of Social Gatherings with Friends and Family: More than three times a week  . Attends Religious Services: Never  . Active Member of Clubs or Organizations: No  . Attends Banker Meetings: Never  . Marital Status: Widowed    Tobacco Counseling Counseling given: Not Answered   Clinical Intake:  Pre-visit preparation completed: Yes  Pain : No/denies pain     Nutritional Risks: None Diabetes: No  How often do you need to have someone help you when you read instructions, pamphlets, or other written materials from your doctor or pharmacy?: 1 - Never    Interpreter Needed?: No  Information entered by :: Kelsey Littler LPN   Activities of Daily Living In your present state of health, do you have any difficulty performing the following activities: 08/05/2020 10/30/2019  Hearing? N N  Comment declines hearing aids -  Vision? N N  Difficulty concentrating or making decisions? N N  Walking or climbing stairs? N N  Dressing or bathing? N N  Doing errands, shopping? N N  Preparing Food and eating ? N -  Using the Toilet? N -  In the past six months, have you accidently leaked urine? N -  Do you have problems with loss of bowel control? N -  Managing your Medications? N -  Managing your Finances? N -  Housekeeping  or managing your Housekeeping? N -  Some recent data might be hidden    Patient Care Team: Kelsey Milan, MD as PCP - General (Internal Medicine) [place] eye Exam (Ophthalmology)  Indicate any recent Medical Services you may have received from other than [facility] providers in the past year (date may be approximate).     Assessment:   This is a routine wellness examination for [facility].  Hearing/Vision screen  Hearing Screening   125Hz  250Hz  500Hz  1000Hz  2000Hz  3000Hz  4000Hz  6000Hz  8000Hz   Right ear:           Left ear:           Comments: Pt denies hearing difficulty  Vision Screening Comments: Sees Dr. for annual eye exams  Dietary issues and exercise activities discussed: Current Exercise Habits: Home exercise routine, Type  of exercise: Other - see comments (exercise bike), Time (Minutes): 20, Frequency (Times/Week): 2, Weekly Exercise (Minutes/Week): 40, Intensity: Moderate, Exercise limited by: None identified  Goals    . Eat more fruits and vegetables     Recommend to increase fruit and vegetable servings to 5 per day    . Increase physical activity     Recommend increasing physical activity to 150 minutes per week.      Depression Screen PHQ 2/9 Scores 08/05/2020 02/07/2020 10/30/2019 10/27/2018 09/06/2017 10/23/2016 04/01/2016  PHQ - 2 Score 0 0 1 0 0 0 0  PHQ- 9 Score - - 4 - - - -    Fall Risk Fall Risk  08/05/2020 10/30/2019 10/27/2018 09/06/2017 10/23/2016  Falls in the past year? 0 0 0 No No  Number falls in past yr: 0 0 0 - -  Injury with Fall? 0 0 0 - -  Risk for fall due to : No Fall Risks - - - -  Follow up Falls prevention discussed Falls evaluation completed Falls evaluation completed - -    Any stairs in or around the home? Yes  If so, are there any without handrails? No  Home free of loose throw rugs in walkways, pet beds, electrical cords, etc? Yes  Adequate lighting in your home to reduce risk of falls? Yes   ASSISTIVE DEVICES UTILIZED TO  PREVENT FALLS:  Life alert? No  Use of a cane, walker or w/c? No  Grab bars in the bathroom? Yes  Shower chair or bench in shower? Yes  Elevated toilet seat or a handicapped toilet? No   TIMED UP AND GO:  Was the test performed? No . Telephonic visit  Cognitive Function:     6CIT Screen 08/05/2020 09/06/2017 10/23/2016  What Year? 0 points 0 points 0 points  What month? 0 points 0 points 0 points  What time? 0 points 0 points 0 points  Count back from 20 0 points 0 points 0 points  Months in reverse 0 points 0 points 0 points  Repeat phrase 0 points 4 points 0 points  Total Score 0 4 0    Immunizations Immunization History  Administered Date(s) Administered  . Influenza, High Dose Seasonal PF 09/06/2017, 09/12/2018  . Influenza,inj,Quad PF,6+ Mos 09/21/2016, 09/07/2019  . Influenza-Unspecified 08/23/2013, 09/12/2018, 09/07/2019  . Moderna SARS-COVID-2 Vaccination 01/06/2020, 02/04/2020  . Pneumococcal Conjugate-13 04/23/2014  . Pneumococcal Polysaccharide-23 09/14/2007  . Td 09/14/2007, 08/29/2011  . Tdap 08/29/2011  . Zoster 05/30/2011    TDAP status: Up to date   Flu Vaccine status: Declined, Education has been provided regarding the importance of this vaccine but patient still declined. Advised may receive this vaccine at local pharmacy or Health Dept. Aware to provide a copy of the vaccination record if obtained from local pharmacy or Health Dept. Verbalized acceptance and understanding.   Pneumococcal vaccine status: Up to date   Covid-19 vaccine status: Completed vaccines  Qualifies for Shingles Vaccine? Yes   Zostavax completed Yes   Shingrix Completed?: No.    Education has been provided regarding the importance of this vaccine. Patient has been advised to call insurance company to determine out of pocket expense if they have not yet received this vaccine. Advised may also receive vaccine at local pharmacy or Health Dept. Verbalized acceptance and  understanding.  Screening Tests Health Maintenance  Topic Date Due  . Hepatitis C Screening  Never done  . INFLUENZA VACCINE  06/23/2020  . TETANUS/TDAP  08/28/2021  .  DEXA SCAN  Completed  . COVID-19 Vaccine  Completed  . PNA vac Low Risk Adult  Completed    Health Maintenance  Health Maintenance Due  Topic Date Due  . Hepatitis C Screening  Never done  . INFLUENZA VACCINE  06/23/2020   Colorectal cancer screening status: no longer required  Mammogram status: Completed 01/17/20. Repeat every year   Bone Density status: Completed 01/17/20. Results reflect: Bone density results: OSTEOPENIA. Repeat every 2 years.  Lung Cancer Screening: (Low Dose CT Chest recommended if Age 54-80 years, 30 pack-year currently smoking OR have quit w/in 15years.) does not qualify.   Additional Screening:  Hepatitis C Screening: does qualify; postponed  Vision Screening: Recommended annual ophthalmology exams for early detection of glaucoma and other disorders of the eye. Is the patient up to date with their annual eye exam?  Yes  Who is the provider or what is the name of the office in which the patient attends annual eye exams? Dr. Dewaine CongerBarker  Dental Screening: Recommended annual dental exams for proper oral hygiene  Community Resource Referral / Chronic Care Management: CRR required this visit?  No   CCM required this visit?  No      Plan:     I have personally reviewed and noted the following in the patient's chart:   . Medical and social history . Use of alcohol, tobacco or illicit drugs  . Current medications and supplements . Functional ability and status . Nutritional status . Physical activity . Advanced directives . List of other physicians . Hospitalizations, surgeries, and ER visits in previous 12 months . Vitals . Screenings to include cognitive, depression, and falls . Referrals and appointments  In addition, I have reviewed and discussed with patient certain  preventive protocols, quality metrics, and best practice recommendations. A written personalized care plan for preventive services as well as general preventive health recommendations were provided to patient.     Kelsey LittlerKasey Anastacio Bua, LPN   1/61/09609/13/2021   Nurse Notes: pt doing well and appreciative of visit today

## 2020-10-31 ENCOUNTER — Ambulatory Visit (INDEPENDENT_AMBULATORY_CARE_PROVIDER_SITE_OTHER): Payer: Medicare Other | Admitting: Internal Medicine

## 2020-10-31 ENCOUNTER — Other Ambulatory Visit: Payer: Self-pay

## 2020-10-31 ENCOUNTER — Encounter: Payer: Self-pay | Admitting: Internal Medicine

## 2020-10-31 VITALS — BP 134/72 | HR 90 | Temp 99.2°F | Ht 66.0 in | Wt 155.0 lb

## 2020-10-31 DIAGNOSIS — R7303 Prediabetes: Secondary | ICD-10-CM | POA: Diagnosis not present

## 2020-10-31 DIAGNOSIS — E039 Hypothyroidism, unspecified: Secondary | ICD-10-CM

## 2020-10-31 DIAGNOSIS — G8929 Other chronic pain: Secondary | ICD-10-CM

## 2020-10-31 DIAGNOSIS — E782 Mixed hyperlipidemia: Secondary | ICD-10-CM | POA: Diagnosis not present

## 2020-10-31 DIAGNOSIS — M8589 Other specified disorders of bone density and structure, multiple sites: Secondary | ICD-10-CM

## 2020-10-31 DIAGNOSIS — Z0000 Encounter for general adult medical examination without abnormal findings: Secondary | ICD-10-CM | POA: Diagnosis not present

## 2020-10-31 DIAGNOSIS — Z1231 Encounter for screening mammogram for malignant neoplasm of breast: Secondary | ICD-10-CM

## 2020-10-31 DIAGNOSIS — Z1159 Encounter for screening for other viral diseases: Secondary | ICD-10-CM

## 2020-10-31 DIAGNOSIS — I491 Atrial premature depolarization: Secondary | ICD-10-CM

## 2020-10-31 DIAGNOSIS — M5442 Lumbago with sciatica, left side: Secondary | ICD-10-CM

## 2020-10-31 LAB — POCT URINALYSIS DIPSTICK
Bilirubin, UA: NEGATIVE
Blood, UA: NEGATIVE
Glucose, UA: NEGATIVE
Ketones, UA: NEGATIVE
Leukocytes, UA: NEGATIVE
Nitrite, UA: NEGATIVE
Protein, UA: NEGATIVE
Spec Grav, UA: 1.02 (ref 1.010–1.025)
Urobilinogen, UA: 0.2 E.U./dL
pH, UA: 6 (ref 5.0–8.0)

## 2020-10-31 NOTE — Progress Notes (Signed)
Date:  10/31/2020   Name:  Kelsey Pacheco   DOB:  1941/03/14   MRN:  [id number]   Chief Complaint: Annual Exam (Breast exam no pap)  Kelsey Pacheco is a 79 y.o. female who presents today for her Complete Annual Exam. She feels well. She reports exercising walking X2-3 days a week. She reports she is sleeping well. Breast complaints none.  Mammogram: 12/2019 DEXA: 12/2019 osteopenia Colonoscopy: 08/2006 aged out  Immunization History  Administered Date(s) Administered  . Influenza, High Dose Seasonal PF 09/06/2017, 09/12/2018  . Influenza,inj,Quad PF,6+ Mos 09/21/2016, 09/07/2019  . Influenza-Unspecified 08/23/2013, 09/12/2018, 09/07/2019, 09/03/2020  . Moderna SARS-COVID-2 Vaccination 01/06/2020, 02/04/2020, 09/26/2020  . Pneumococcal Conjugate-13 04/23/2014  . Pneumococcal Polysaccharide-23 09/14/2007  . Td 09/14/2007, 08/29/2011  . Tdap 08/29/2011  . Zoster 05/30/2011    Thyroid Problem Presents for follow-up visit. Patient reports no anxiety, constipation, diarrhea, fatigue, palpitations or tremors. The symptoms have been stable. Her past medical history is significant for hyperlipidemia.  Hyperlipidemia The problem is controlled. Pertinent negatives include no chest pain or shortness of breath. Current antihyperlipidemic treatment includes statins. The current treatment provides significant improvement of lipids. There are no compliance problems.   Diabetes She presents for her follow-up diabetic visit. Diabetes type: prediabetes. Her disease course has been stable. Pertinent negatives for hypoglycemia include no dizziness, headaches, nervousness/anxiousness or tremors. Pertinent negatives for diabetes include no chest pain, no fatigue, no polydipsia and no polyuria.  Hip Pain  There was no injury mechanism. The pain is present in the left hip. The quality of the pain is described as aching. The pain is mild. The pain has been fluctuating since onset. The symptoms  are aggravated by weight bearing. Treatments tried: chiropractic. The treatment provided mild relief.    Lab Results  Component Value Date   CREATININE 0.91 10/30/2019   BUN 13 10/30/2019   NA 140 10/30/2019   K 4.4 10/30/2019   CL 100 10/30/2019   CO2 24 10/30/2019   Lab Results  Component Value Date   CHOL 192 10/30/2019   HDL 91 10/30/2019   LDLCALC 74 10/30/2019   TRIG 162 (H) 10/30/2019   CHOLHDL 2.1 10/30/2019   Lab Results  Component Value Date   TSH 2.670 10/30/2019   Lab Results  Component Value Date   HGBA1C 6.4 (H) 10/27/2018   Lab Results  Component Value Date   WBC 7.1 10/30/2019   HGB 13.6 10/30/2019   HCT 41.0 10/30/2019   MCV 92 10/30/2019   PLT 256 10/30/2019   Lab Results  Component Value Date   ALT 22 10/30/2019   AST 23 10/30/2019   ALKPHOS 120 (H) 10/30/2019   BILITOT 0.5 10/30/2019     Review of Systems  Constitutional: Negative for chills, fatigue and fever.  HENT: Negative for congestion, hearing loss, tinnitus, trouble swallowing and voice change.   Eyes: Negative for visual disturbance.  Respiratory: Negative for cough, chest tightness, shortness of breath and wheezing.   Cardiovascular: Negative for chest pain, palpitations and leg swelling.  Gastrointestinal: Negative for abdominal pain, constipation, diarrhea and vomiting.  Endocrine: Negative for polydipsia and polyuria.  Genitourinary: Negative for dysuria, frequency, genital sores, vaginal bleeding and vaginal discharge.  Musculoskeletal: Positive for back pain. Negative for arthralgias (left hip and low back), gait problem and joint swelling (arthritis).  Skin: Negative for color change and rash.  Neurological: Negative for dizziness, tremors, light-headedness and headaches.  Hematological: Negative for adenopathy. Does not bruise/bleed easily.  Psychiatric/Behavioral: Negative for dysphoric mood and sleep disturbance. The patient is not nervous/anxious.     Patient Active  Problem List   Diagnosis Date Noted  . 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 PAC (premature atrial contraction) 10/25/2017  . Left-sided low back pain with left-sided sciatica 04/01/2016  . Osteopenia 11/20/2015  . Allergic rhinitis 11/15/2015  . Body mass index (BMI) of 25.0-25.9 in adult 11/15/2015  . Prediabetes 11/15/2015  . Acquired hypothyroidism 10/16/2015  . Mixed hyperlipidemia 06/24/2015    Allergies  Allergen Reactions  . Fosamax [Alendronate] Other (See Comments)    Body aches    Past Surgical History:  Procedure Laterality Date  . BASAL CELL CARCINOMA EXCISION  06/04/11    [facility] (Mohs procedure) left orbital Dr. Cheryll Dessert  . BREAST BIOPSY Right 2006   bx/clip-neg  . CATARACT EXTRACTION W/PHACO Right 03/16/2018   Procedure: CATARACT EXTRACTION PHACO AND INTRAOCULAR LENS PLACEMENT (IOC) RIGHT;  Surgeon: Lockie Mola, MD;  Location: [facility];  Service: Ophthalmology;  Laterality: Right;  requests early  . Cologuard home test  12/2015   Negative  . RETINAL DETACHMENT SURGERY Right 2018    Social History   Tobacco Use  . Smoking status: Never Smoker  . Smokeless tobacco: Never Used  Vaping Use  . Vaping Use: Never used  Substance Use Topics  . Alcohol use: No  . Drug use: No     Medication list has been reviewed and updated.  Current Meds  Medication Sig  . Ascorbic Acid (VITAMIN C) 100 MG tablet Take 1 tablet by mouth daily.  Marland Kitchen aspirin 81 MG tablet Take 1 tablet by mouth daily.  Marland Kitchen atorvastatin (LIPITOR) 40 MG tablet TAKE 1 TABLET BY MOUTH EVERY DAY  . Calcium Carbonate (CALCIUM 600 PO) Take 1 tablet by mouth daily.  Marland Kitchen levothyroxine (SYNTHROID) 100 MCG tablet TAKE 1 TABLET(100 MCG) BY MOUTH DAILY  . MULTIPLE VITAMIN PO Take 1 tablet by mouth daily.  . Vitamin D, Cholecalciferol, 1000 UNITS TABS Take 1 tablet by mouth daily.  . Vitamin E 100 UNITS TABS Take 1 capsule by mouth daily.    PHQ 2/9 Scores 10/31/2020 08/05/2020 02/07/2020 10/30/2019  PHQ  - 2 Score 0 0 0 1  PHQ- 9 Score 0 - - 4    GAD 7 : Generalized Anxiety Score 10/31/2020  Nervous, Anxious, on Edge 0  Control/stop worrying 0  Worry too much - different things 0  Trouble relaxing 0  Restless 0  Easily annoyed or irritable 0  Afraid - awful might happen 0  Total GAD 7 Score 0    BP Readings from Last 3 Encounters:  10/31/20 134/72  02/07/20 135/84  10/30/19 120/72    Physical Exam Vitals and nursing note reviewed.  Constitutional:      General: She is not in acute distress.    Appearance: She is well-developed.  HENT:     Head: Normocephalic and atraumatic.     Right Ear: Tympanic membrane and ear canal normal.     Left Ear: Tympanic membrane and ear canal normal.     Nose:     Right Sinus: No maxillary sinus tenderness.     Left Sinus: No maxillary sinus tenderness.  Eyes:     General: No scleral icterus.       Right eye: No discharge.        Left eye: No discharge.     Conjunctiva/sclera: Conjunctivae normal.  Neck:     Thyroid: No thyromegaly.  Vascular: No carotid bruit.  Cardiovascular:     Rate and Rhythm: Normal rate and regular rhythm. Frequent extrasystoles are present.    Pulses: Normal pulses.     Heart sounds: Normal heart sounds.  Pulmonary:     Effort: Pulmonary effort is normal. No respiratory distress.     Breath sounds: No wheezing.  Chest:  Breasts:     Right: No mass, nipple discharge, skin change or tenderness.     Left: No mass, nipple discharge, skin change or tenderness.    Abdominal:     General: Bowel sounds are normal.     Palpations: Abdomen is soft.     Tenderness: There is no abdominal tenderness.  Musculoskeletal:     Cervical back: Normal range of motion. No erythema.     Right hip: Normal.     Left hip: No bony tenderness or crepitus. Decreased range of motion.     Right lower leg: No edema.     Left lower leg: No edema.  Lymphadenopathy:     Cervical: No cervical adenopathy.  Skin:    General: Skin  is warm and dry.     Capillary Refill: Capillary refill takes less than 2 seconds.     Findings: No rash.  Neurological:     General: No focal deficit present.     Mental Status: She is alert and oriented to person, place, and time.     Cranial Nerves: No cranial nerve deficit.     Sensory: No sensory deficit.     Deep Tendon Reflexes: Reflexes are normal and symmetric.  Psychiatric:        Attention and Perception: Attention normal.        Mood and Affect: Mood normal.     Wt Readings from Last 3 Encounters:  10/31/20 155 lb (70.3 kg)  02/07/20 160 lb (72.6 kg)  10/30/19 154 lb (69.9 kg)    BP 134/72   Pulse 90   Temp 99.2 F (37.3 C) (Oral)   Ht 5\' 6"  (1.676 m)   Wt 155 lb (70.3 kg)   SpO2 98%   BMI 25.02 kg/m   Assessment and Plan: 1. Annual physical exam Normal exam Continue exercise, healthy diet - POCT urinalysis dipstick  2. Encounter for screening mammogram for breast cancer Schedule at [facility] - MM 3D SCREEN BREAST BILATERAL; Future  3. Need for hepatitis C screening test - Hepatitis C antibody  4. Acquired hypothyroidism supplemented - TSH + free T4  5. Mixed hyperlipidemia On statin therapy without concerns - Comprehensive metabolic panel - Lipid panel  6. Prediabetes Check labs and advise - CBC with Differential/Platelet - Hemoglobin A1c  7. Osteopenia of multiple sites Continue calcium, vitamin D and exercise - VITAMIN D 25 Hydroxy (Vit-D Deficiency, Fractures)  8. PAC (premature atrial contraction) Frequent but asx Limit caffeine  9. Chronic left-sided low back pain with left-sided sciatica Consider Mobic daily - pt will consider and call if desired   Partially dictated using Dragon software. Any errors are unintentional.  [facility], MD [facility]  10/31/2020

## 2020-11-01 LAB — TSH+FREE T4
Free T4: 1.49 ng/dL (ref 0.82–1.77)
TSH: 2.62 u[IU]/mL (ref 0.450–4.500)

## 2020-11-01 LAB — COMPREHENSIVE METABOLIC PANEL
ALT: 22 IU/L (ref 0–32)
AST: 25 IU/L (ref 0–40)
Albumin/Globulin Ratio: 2.5 — ABNORMAL HIGH (ref 1.2–2.2)
Albumin: 4.9 g/dL — ABNORMAL HIGH (ref 3.7–4.7)
Alkaline Phosphatase: 111 IU/L (ref 44–121)
BUN/Creatinine Ratio: 14 (ref 12–28)
BUN: 13 mg/dL (ref 8–27)
Bilirubin Total: 0.4 mg/dL (ref 0.0–1.2)
CO2: 22 mmol/L (ref 20–29)
Calcium: 9.6 mg/dL (ref 8.7–10.3)
Chloride: 102 mmol/L (ref 96–106)
Creatinine, Ser: 0.93 mg/dL (ref 0.57–1.00)
GFR calc Af Amer: 68 mL/min/{1.73_m2} (ref >59)
GFR calc non Af Amer: 59 mL/min/{1.73_m2} — ABNORMAL LOW (ref >59)
Globulin, Total: 2 g/dL (ref 1.5–4.5)
Glucose: 135 mg/dL — ABNORMAL HIGH (ref 65–99)
Potassium: 4.4 mmol/L (ref 3.5–5.2)
Sodium: 142 mmol/L (ref 134–144)
Total Protein: 6.9 g/dL (ref 6.0–8.5)

## 2020-11-01 LAB — CBC WITH DIFFERENTIAL/PLATELET
Basophils Absolute: 0.1 10*3/uL (ref 0.0–0.2)
Basos: 1 %
EOS (ABSOLUTE): 0.1 10*3/uL (ref 0.0–0.4)
Eos: 1 %
Hematocrit: 41 % (ref 34.0–46.6)
Hemoglobin: 13.6 g/dL (ref 11.1–15.9)
Immature Grans (Abs): 0 10*3/uL (ref 0.0–0.1)
Immature Granulocytes: 0 %
Lymphocytes Absolute: 1.3 10*3/uL (ref 0.7–3.1)
Lymphs: 18 %
MCH: 30.2 pg (ref 26.6–33.0)
MCHC: 33.2 g/dL (ref 31.5–35.7)
MCV: 91 fL (ref 79–97)
Monocytes Absolute: 0.5 10*3/uL (ref 0.1–0.9)
Monocytes: 6 %
Neutrophils Absolute: 5.5 10*3/uL (ref 1.4–7.0)
Neutrophils: 74 %
Platelets: 237 10*3/uL (ref 150–450)
RBC: 4.5 x10E6/uL (ref 3.77–5.28)
RDW: 12.1 % (ref 11.7–15.4)
WBC: 7.4 10*3/uL (ref 3.4–10.8)

## 2020-11-01 LAB — LIPID PANEL
Chol/HDL Ratio: 2.4 ratio (ref 0.0–4.4)
Cholesterol, Total: 208 mg/dL — ABNORMAL HIGH (ref 100–199)
HDL: 86 mg/dL (ref >39)
LDL Chol Calc (NIH): 94 mg/dL (ref 0–99)
Triglycerides: 168 mg/dL — ABNORMAL HIGH (ref 0–149)
VLDL Cholesterol Cal: 28 mg/dL (ref 5–40)

## 2020-11-01 LAB — VITAMIN D 25 HYDROXY (VIT D DEFICIENCY, FRACTURES): Vit D, 25-Hydroxy: 34.6 ng/mL (ref 30.0–100.0)

## 2020-11-01 LAB — HEMOGLOBIN A1C
Est. average glucose Bld gHb Est-mCnc: 134 mg/dL
Hgb A1c MFr Bld: 6.3 % — ABNORMAL HIGH (ref 4.8–5.6)

## 2020-11-01 LAB — HEPATITIS C ANTIBODY: Hep C Virus Ab: <0.1 s/co ratio (ref 0.0–0.9)

## 2020-11-26 DIAGNOSIS — M546 Pain in thoracic spine: Secondary | ICD-10-CM | POA: Diagnosis not present

## 2020-11-26 DIAGNOSIS — M9902 Segmental and somatic dysfunction of thoracic region: Secondary | ICD-10-CM | POA: Diagnosis not present

## 2020-11-26 DIAGNOSIS — M9903 Segmental and somatic dysfunction of lumbar region: Secondary | ICD-10-CM | POA: Diagnosis not present

## 2020-11-26 DIAGNOSIS — M9905 Segmental and somatic dysfunction of pelvic region: Secondary | ICD-10-CM | POA: Diagnosis not present

## 2020-11-26 DIAGNOSIS — M5442 Lumbago with sciatica, left side: Secondary | ICD-10-CM | POA: Diagnosis not present

## 2020-11-26 DIAGNOSIS — M955 Acquired deformity of pelvis: Secondary | ICD-10-CM | POA: Diagnosis not present

## 2020-12-17 DIAGNOSIS — M9905 Segmental and somatic dysfunction of pelvic region: Secondary | ICD-10-CM | POA: Diagnosis not present

## 2020-12-17 DIAGNOSIS — M9903 Segmental and somatic dysfunction of lumbar region: Secondary | ICD-10-CM | POA: Diagnosis not present

## 2020-12-17 DIAGNOSIS — M9902 Segmental and somatic dysfunction of thoracic region: Secondary | ICD-10-CM | POA: Diagnosis not present

## 2020-12-17 DIAGNOSIS — M955 Acquired deformity of pelvis: Secondary | ICD-10-CM | POA: Diagnosis not present

## 2020-12-17 DIAGNOSIS — M5136 Other intervertebral disc degeneration, lumbar region: Secondary | ICD-10-CM | POA: Diagnosis not present

## 2020-12-17 DIAGNOSIS — M546 Pain in thoracic spine: Secondary | ICD-10-CM | POA: Diagnosis not present

## 2020-12-31 DIAGNOSIS — M9903 Segmental and somatic dysfunction of lumbar region: Secondary | ICD-10-CM | POA: Diagnosis not present

## 2020-12-31 DIAGNOSIS — M955 Acquired deformity of pelvis: Secondary | ICD-10-CM | POA: Diagnosis not present

## 2020-12-31 DIAGNOSIS — M9905 Segmental and somatic dysfunction of pelvic region: Secondary | ICD-10-CM | POA: Diagnosis not present

## 2020-12-31 DIAGNOSIS — M5136 Other intervertebral disc degeneration, lumbar region: Secondary | ICD-10-CM | POA: Diagnosis not present

## 2020-12-31 DIAGNOSIS — M546 Pain in thoracic spine: Secondary | ICD-10-CM | POA: Diagnosis not present

## 2020-12-31 DIAGNOSIS — M9902 Segmental and somatic dysfunction of thoracic region: Secondary | ICD-10-CM | POA: Diagnosis not present

## 2021-01-20 ENCOUNTER — Ambulatory Visit
Admission: RE | Admit: 2021-01-20 | Discharge: 2021-01-20 | Disposition: A | Discharge status: Home / Self Care | Payer: Medicare Other | Source: Ambulatory Visit | Attending: Internal Medicine | Admitting: Internal Medicine

## 2021-01-20 ENCOUNTER — Other Ambulatory Visit: Payer: Self-pay

## 2021-01-20 DIAGNOSIS — Z1231 Encounter for screening mammogram for malignant neoplasm of breast: Secondary | ICD-10-CM | POA: Insufficient documentation

## 2021-01-21 DIAGNOSIS — M9902 Segmental and somatic dysfunction of thoracic region: Secondary | ICD-10-CM | POA: Diagnosis not present

## 2021-01-21 DIAGNOSIS — M9903 Segmental and somatic dysfunction of lumbar region: Secondary | ICD-10-CM | POA: Diagnosis not present

## 2021-01-21 DIAGNOSIS — M955 Acquired deformity of pelvis: Secondary | ICD-10-CM | POA: Diagnosis not present

## 2021-01-21 DIAGNOSIS — M546 Pain in thoracic spine: Secondary | ICD-10-CM | POA: Diagnosis not present

## 2021-01-21 DIAGNOSIS — M5136 Other intervertebral disc degeneration, lumbar region: Secondary | ICD-10-CM | POA: Diagnosis not present

## 2021-01-21 DIAGNOSIS — M9905 Segmental and somatic dysfunction of pelvic region: Secondary | ICD-10-CM | POA: Diagnosis not present

## 2021-01-22 ENCOUNTER — Other Ambulatory Visit: Payer: Self-pay | Admitting: Internal Medicine

## 2021-01-22 DIAGNOSIS — E039 Hypothyroidism, unspecified: Secondary | ICD-10-CM

## 2021-01-22 NOTE — Telephone Encounter (Signed)
Requested Prescriptions  Pending Prescriptions Disp Refills  . levothyroxine (SYNTHROID) 100 MCG tablet [Pharmacy Med Name: LEVOTHYROXINE 0.100MG  ( ) TAB] 90 tablet 1    Sig: TAKE 1 TABLET(100 MCG) BY MOUTH DAILY     Endocrinology:  Hypothyroid Agents Failed - 01/22/2021  6:21 AM      Failed - TSH needs to be rechecked within 3 months after an abnormal result. Refill until TSH is due.      Passed - TSH in normal range and within 360 days    TSH  Date Value Ref Range Status  10/31/2020 2.620 0.450 - 4.500 uIU/mL Final         Passed - Valid encounter within last 12 months    Recent Outpatient Visits          2 months ago Annual physical exam   [facility] Reubin Milan, MD   11 months ago Traumatic hematoma of left hand, initial encounter   [facility] Reubin Milan, MD   1 year ago Annual physical exam   [facility] Reubin Milan, MD   2 years ago Annual physical exam   [facility] Reubin Milan, MD   3 years ago Annual physical exam   [facility] Reubin Milan, MD      Future Appointments            In 9 months Judithann Graves Nyoka Cowden, MD [facility]           . atorvastatin (LIPITOR) 40 MG tablet [Pharmacy Med Name: ATORVASTATIN 40MG  TABLETS] 90 tablet 3    Sig: TAKE 1 TABLET BY MOUTH EVERY DAY     Cardiovascular:  Antilipid - Statins Failed - 01/22/2021  6:21 AM      Failed - Total Cholesterol in normal range and within 360 days    Cholesterol, Total  Date Value Ref Range Status  10/31/2020 208 (H) 100 - 199 mg/dL Final         Failed - LDL in normal range and within 360 days    LDL Chol Calc (NIH)  Date Value Ref Range Status  10/31/2020 94 0 - 99 mg/dL Final         Failed - Triglycerides in normal range and within 360 days    Triglycerides  Date Value Ref Range Status  10/31/2020 168 (H) 0 - 149 mg/dL Final         Passed - HDL in normal range and within 360 days    HDL   Date Value Ref Range Status  10/31/2020 86 >39 mg/dL Final         Passed - Patient is not pregnant      Passed - Valid encounter within last 12 months    Recent Outpatient Visits          2 months ago Annual physical exam   [facility], MD   11 months ago Traumatic hematoma of left hand, initial encounter   [facility], MD   1 year ago Annual physical exam   [facility], MD   2 years ago Annual physical exam   [facility], MD   3 years ago Annual physical exam   [facility], MD      Future Appointments            In 9  months Reubin Milan, MD [facility], [facility]

## 2021-02-11 DIAGNOSIS — M5136 Other intervertebral disc degeneration, lumbar region: Secondary | ICD-10-CM | POA: Diagnosis not present

## 2021-02-11 DIAGNOSIS — M9902 Segmental and somatic dysfunction of thoracic region: Secondary | ICD-10-CM | POA: Diagnosis not present

## 2021-02-11 DIAGNOSIS — M546 Pain in thoracic spine: Secondary | ICD-10-CM | POA: Diagnosis not present

## 2021-02-11 DIAGNOSIS — M9903 Segmental and somatic dysfunction of lumbar region: Secondary | ICD-10-CM | POA: Diagnosis not present

## 2021-02-11 DIAGNOSIS — M955 Acquired deformity of pelvis: Secondary | ICD-10-CM | POA: Diagnosis not present

## 2021-02-11 DIAGNOSIS — M9905 Segmental and somatic dysfunction of pelvic region: Secondary | ICD-10-CM | POA: Diagnosis not present

## 2021-02-25 DIAGNOSIS — M9902 Segmental and somatic dysfunction of thoracic region: Secondary | ICD-10-CM | POA: Diagnosis not present

## 2021-02-25 DIAGNOSIS — M5136 Other intervertebral disc degeneration, lumbar region: Secondary | ICD-10-CM | POA: Diagnosis not present

## 2021-02-25 DIAGNOSIS — M955 Acquired deformity of pelvis: Secondary | ICD-10-CM | POA: Diagnosis not present

## 2021-02-25 DIAGNOSIS — M546 Pain in thoracic spine: Secondary | ICD-10-CM | POA: Diagnosis not present

## 2021-02-25 DIAGNOSIS — M9905 Segmental and somatic dysfunction of pelvic region: Secondary | ICD-10-CM | POA: Diagnosis not present

## 2021-02-25 DIAGNOSIS — M9903 Segmental and somatic dysfunction of lumbar region: Secondary | ICD-10-CM | POA: Diagnosis not present

## 2021-03-11 DIAGNOSIS — M9905 Segmental and somatic dysfunction of pelvic region: Secondary | ICD-10-CM | POA: Diagnosis not present

## 2021-03-11 DIAGNOSIS — M9903 Segmental and somatic dysfunction of lumbar region: Secondary | ICD-10-CM | POA: Diagnosis not present

## 2021-03-11 DIAGNOSIS — M5136 Other intervertebral disc degeneration, lumbar region: Secondary | ICD-10-CM | POA: Diagnosis not present

## 2021-03-11 DIAGNOSIS — M9902 Segmental and somatic dysfunction of thoracic region: Secondary | ICD-10-CM | POA: Diagnosis not present

## 2021-03-11 DIAGNOSIS — M955 Acquired deformity of pelvis: Secondary | ICD-10-CM | POA: Diagnosis not present

## 2021-03-11 DIAGNOSIS — M546 Pain in thoracic spine: Secondary | ICD-10-CM | POA: Diagnosis not present

## 2021-03-18 DIAGNOSIS — M9905 Segmental and somatic dysfunction of pelvic region: Secondary | ICD-10-CM | POA: Diagnosis not present

## 2021-03-18 DIAGNOSIS — M955 Acquired deformity of pelvis: Secondary | ICD-10-CM | POA: Diagnosis not present

## 2021-03-18 DIAGNOSIS — M546 Pain in thoracic spine: Secondary | ICD-10-CM | POA: Diagnosis not present

## 2021-03-18 DIAGNOSIS — M5136 Other intervertebral disc degeneration, lumbar region: Secondary | ICD-10-CM | POA: Diagnosis not present

## 2021-03-18 DIAGNOSIS — M9902 Segmental and somatic dysfunction of thoracic region: Secondary | ICD-10-CM | POA: Diagnosis not present

## 2021-03-18 DIAGNOSIS — M9903 Segmental and somatic dysfunction of lumbar region: Secondary | ICD-10-CM | POA: Diagnosis not present

## 2021-04-09 DIAGNOSIS — M9903 Segmental and somatic dysfunction of lumbar region: Secondary | ICD-10-CM | POA: Diagnosis not present

## 2021-04-09 DIAGNOSIS — M955 Acquired deformity of pelvis: Secondary | ICD-10-CM | POA: Diagnosis not present

## 2021-04-09 DIAGNOSIS — M5136 Other intervertebral disc degeneration, lumbar region: Secondary | ICD-10-CM | POA: Diagnosis not present

## 2021-04-09 DIAGNOSIS — M546 Pain in thoracic spine: Secondary | ICD-10-CM | POA: Diagnosis not present

## 2021-04-09 DIAGNOSIS — M9902 Segmental and somatic dysfunction of thoracic region: Secondary | ICD-10-CM | POA: Diagnosis not present

## 2021-04-09 DIAGNOSIS — M9905 Segmental and somatic dysfunction of pelvic region: Secondary | ICD-10-CM | POA: Diagnosis not present

## 2021-04-22 DIAGNOSIS — M9903 Segmental and somatic dysfunction of lumbar region: Secondary | ICD-10-CM | POA: Diagnosis not present

## 2021-04-22 DIAGNOSIS — M9902 Segmental and somatic dysfunction of thoracic region: Secondary | ICD-10-CM | POA: Diagnosis not present

## 2021-04-22 DIAGNOSIS — M955 Acquired deformity of pelvis: Secondary | ICD-10-CM | POA: Diagnosis not present

## 2021-04-22 DIAGNOSIS — M5136 Other intervertebral disc degeneration, lumbar region: Secondary | ICD-10-CM | POA: Diagnosis not present

## 2021-04-22 DIAGNOSIS — M546 Pain in thoracic spine: Secondary | ICD-10-CM | POA: Diagnosis not present

## 2021-04-22 DIAGNOSIS — M9905 Segmental and somatic dysfunction of pelvic region: Secondary | ICD-10-CM | POA: Diagnosis not present

## 2021-05-20 DIAGNOSIS — M9903 Segmental and somatic dysfunction of lumbar region: Secondary | ICD-10-CM | POA: Diagnosis not present

## 2021-05-20 DIAGNOSIS — M5136 Other intervertebral disc degeneration, lumbar region: Secondary | ICD-10-CM | POA: Diagnosis not present

## 2021-05-20 DIAGNOSIS — M546 Pain in thoracic spine: Secondary | ICD-10-CM | POA: Diagnosis not present

## 2021-05-20 DIAGNOSIS — M9905 Segmental and somatic dysfunction of pelvic region: Secondary | ICD-10-CM | POA: Diagnosis not present

## 2021-05-20 DIAGNOSIS — M9902 Segmental and somatic dysfunction of thoracic region: Secondary | ICD-10-CM | POA: Diagnosis not present

## 2021-05-20 DIAGNOSIS — M955 Acquired deformity of pelvis: Secondary | ICD-10-CM | POA: Diagnosis not present

## 2021-07-07 DIAGNOSIS — H04123 Dry eye syndrome of bilateral lacrimal glands: Secondary | ICD-10-CM | POA: Diagnosis not present

## 2021-07-07 DIAGNOSIS — T1511XA Foreign body in conjunctival sac, right eye, initial encounter: Secondary | ICD-10-CM | POA: Diagnosis not present

## 2021-07-21 ENCOUNTER — Other Ambulatory Visit: Payer: Self-pay | Admitting: Internal Medicine

## 2021-07-21 DIAGNOSIS — E039 Hypothyroidism, unspecified: Secondary | ICD-10-CM

## 2021-08-06 ENCOUNTER — Ambulatory Visit: Payer: Medicare Other

## 2021-08-07 ENCOUNTER — Telehealth: Payer: Self-pay | Admitting: Internal Medicine

## 2021-08-07 NOTE — Telephone Encounter (Signed)
Copied from CRM [id number]. Topic: Medicare AWV >> Aug 07, 2021  9:59 AM Claudette Laws R wrote: Reason for CRM: Left message for patient to call back and schedule Medicare Annual Wellness Visit (AWV) in office.   If unable to come into the office for AWV,  please offer to do virtually or by telephone.  Last AWV: 08/05/2020  Please schedule at anytime with [facility] Health Advisor.  40 minute appointment  Any questions, please contact me at [phone]

## 2021-09-16 ENCOUNTER — Telehealth: Payer: Self-pay | Admitting: Internal Medicine

## 2021-09-16 NOTE — Telephone Encounter (Signed)
Copied from CRM [id number]. Topic: Medicare AWV >> Sep 16, 2021 12:23 PM Claudette Laws R wrote: Reason for CRM:  Left message for patient to call back and schedule Medicare Annual Wellness Visit (AWV) in office.   If unable to come into the office for AWV,  please offer to do virtually or by telephone.  Last AWV: 08/05/2020  Please schedule at anytime with [facility] Health Advisor.  40 minute appointment  Any questions, please contact me at [phone]

## 2021-11-06 ENCOUNTER — Ambulatory Visit (INDEPENDENT_AMBULATORY_CARE_PROVIDER_SITE_OTHER): Payer: Medicare Other | Admitting: Internal Medicine

## 2021-11-06 ENCOUNTER — Encounter: Payer: Self-pay | Admitting: Internal Medicine

## 2021-11-06 ENCOUNTER — Other Ambulatory Visit: Payer: Self-pay

## 2021-11-06 VITALS — BP 128/74 | HR 86 | Ht 66.0 in | Wt 157.6 lb

## 2021-11-06 DIAGNOSIS — Z1211 Encounter for screening for malignant neoplasm of colon: Secondary | ICD-10-CM | POA: Diagnosis not present

## 2021-11-06 DIAGNOSIS — E039 Hypothyroidism, unspecified: Secondary | ICD-10-CM

## 2021-11-06 DIAGNOSIS — Z0000 Encounter for general adult medical examination without abnormal findings: Secondary | ICD-10-CM

## 2021-11-06 DIAGNOSIS — M8589 Other specified disorders of bone density and structure, multiple sites: Secondary | ICD-10-CM | POA: Diagnosis not present

## 2021-11-06 DIAGNOSIS — R7303 Prediabetes: Secondary | ICD-10-CM | POA: Diagnosis not present

## 2021-11-06 DIAGNOSIS — Z1231 Encounter for screening mammogram for malignant neoplasm of breast: Secondary | ICD-10-CM | POA: Diagnosis not present

## 2021-11-06 DIAGNOSIS — E782 Mixed hyperlipidemia: Secondary | ICD-10-CM | POA: Diagnosis not present

## 2021-11-06 NOTE — Progress Notes (Addendum)
Date:  11/06/2021   Name:  Kelsey Pacheco   DOB:  03-15-1941   MRN:  [id number]   Chief Complaint: Annual Exam (Breast Exam.) Kelsey Pacheco is a 80 y.o. female who presents today for her Complete Annual Exam. She feels well. She reports exercising at home on treadmill or bike. She reports she is sleeping fairly well. Breast complaints - none.  Mammogram: 01/2021 DEXA: 12/2019 osteopenia Pap smear: discontinued Colonoscopy: 2007  Immunization History  Administered Date(s) Administered   Influenza, High Dose Seasonal PF 09/06/2017, 09/12/2018   Influenza,inj,Quad PF,6+ Mos 09/21/2016, 09/07/2019   Influenza-Unspecified 08/23/2013, 09/07/2019, 09/03/2020, 09/12/2021   Moderna Sars-Covid-2 Vaccination 01/06/2020, 02/04/2020, 09/26/2020   Pneumococcal Conjugate-13 04/23/2014   Pneumococcal Polysaccharide-23 09/14/2007   Td 09/14/2007, 08/29/2011   Tdap 08/29/2011   Zoster, Live 05/30/2011    Hyperlipidemia This is a chronic problem. The problem is controlled. Pertinent negatives include no chest pain or shortness of breath. Current antihyperlipidemic treatment includes statins. The current treatment provides significant improvement of lipids. There are no compliance problems.   Thyroid Problem Presents for follow-up visit. Patient reports no anxiety, constipation, diarrhea, fatigue, palpitations or tremors. The symptoms have been stable. Her past medical history is significant for hyperlipidemia.   Lab Results  Component Value Date   NA 142 10/31/2020   K 4.4 10/31/2020   CO2 22 10/31/2020   GLUCOSE 135 (H) 10/31/2020   BUN 13 10/31/2020   CREATININE 0.93 10/31/2020   CALCIUM 9.6 10/31/2020   GFRNONAA 59 (L) 10/31/2020   Lab Results  Component Value Date   CHOL 208 (H) 10/31/2020   HDL 86 10/31/2020   LDLCALC 94 10/31/2020   TRIG 168 (H) 10/31/2020   CHOLHDL 2.4 10/31/2020   Lab Results  Component Value Date   TSH 2.620 10/31/2020   Lab Results   Component Value Date   HGBA1C 6.3 (H) 10/31/2020   Lab Results  Component Value Date   WBC 7.4 10/31/2020   HGB 13.6 10/31/2020   HCT 41.0 10/31/2020   MCV 91 10/31/2020   PLT 237 10/31/2020   Lab Results  Component Value Date   ALT 22 10/31/2020   AST 25 10/31/2020   ALKPHOS 111 10/31/2020   BILITOT 0.4 10/31/2020   Lab Results  Component Value Date   VD25OH 34.6 10/31/2020     Review of Systems  Constitutional:  Negative for chills, fatigue and fever.  HENT:  Negative for congestion, hearing loss, tinnitus, trouble swallowing and voice change.   Eyes:  Negative for visual disturbance.  Respiratory:  Negative for cough, chest tightness, shortness of breath and wheezing.   Cardiovascular:  Negative for chest pain, palpitations and leg swelling.  Gastrointestinal:  Negative for abdominal pain, constipation, diarrhea and vomiting.  Endocrine: Negative for polydipsia and polyuria.  Genitourinary:  Negative for dysuria, frequency, genital sores, vaginal bleeding and vaginal discharge.  Musculoskeletal:  Positive for arthralgias (left hip and knee). Negative for gait problem and joint swelling.  Skin:  Negative for color change and rash.  Neurological:  Negative for dizziness, tremors, light-headedness and headaches.  Hematological:  Negative for adenopathy. Does not bruise/bleed easily.  Psychiatric/Behavioral:  Negative for dysphoric mood and sleep disturbance. The patient is not nervous/anxious.    Patient Active Problem List   Diagnosis Date Noted   Retinal detachment 10/25/2017   PAC (premature atrial contraction) 10/25/2017   Left-sided low back pain with left-sided sciatica 04/01/2016   Osteopenia 11/20/2015   Allergic rhinitis 11/15/2015  Body mass index (BMI) of 25.0-25.9 in adult 11/15/2015   Prediabetes 11/15/2015   Acquired hypothyroidism 10/16/2015   Mixed hyperlipidemia 06/24/2015    Allergies  Allergen Reactions   Fosamax [Alendronate] Other (See  Comments)    Body aches    Past Surgical History:  Procedure Laterality Date   BASAL CELL CARCINOMA EXCISION  06/04/11    [facility] (Mohs procedure) left orbital Dr. Shelda Jakes   BREAST BIOPSY Right 2006   bx/clip-neg   CATARACT EXTRACTION W/PHACO Right 03/16/2018   Procedure: CATARACT EXTRACTION PHACO AND INTRAOCULAR LENS PLACEMENT ([place]) RIGHT;  Surgeon: Leandrew Koyanagi, MD;  Location: [place];  Service: Ophthalmology;  Laterality: Right;  requests early   Cologuard home test  12/2015   Negative   RETINAL DETACHMENT SURGERY Right 2018    Social History   Tobacco Use   Smoking status: Never   Smokeless tobacco: Never  Vaping Use   Vaping Use: Never used  Substance Use Topics   Alcohol use: No   Drug use: No     Medication list has been reviewed and updated.  Current Meds  Medication Sig   Ascorbic Acid (VITAMIN C) 100 MG tablet Take 1 tablet by mouth daily.   aspirin 81 MG tablet Take 1 tablet by mouth daily.   atorvastatin (LIPITOR) 40 MG tablet TAKE 1 TABLET BY MOUTH EVERY DAY   Calcium Carbonate (CALCIUM 600 PO) Take 1 tablet by mouth daily.   levothyroxine (SYNTHROID) 100 MCG tablet TAKE 1 TABLET(100 MCG) BY MOUTH DAILY   MULTIPLE VITAMIN PO Take 1 tablet by mouth daily.   Vitamin D, Cholecalciferol, 1000 UNITS TABS Take 1 tablet by mouth daily.   Vitamin E 100 UNITS TABS Take 1 capsule by mouth daily.    PHQ 2/9 Scores 11/06/2021 10/31/2020 08/05/2020 02/07/2020  PHQ - 2 Score 0 0 0 0  PHQ- 9 Score 1 0 - -    GAD 7 : Generalized Anxiety Score 11/06/2021 10/31/2020  Nervous, Anxious, on Edge 1 0  Control/stop worrying 0 0  Worry too much - different things 0 0  Trouble relaxing 0 0  Restless 0 0  Easily annoyed or irritable 0 0  Afraid - awful might happen 0 0  Total GAD 7 Score 1 0  Anxiety Difficulty Not difficult at all -    BP Readings from Last 3 Encounters:  11/06/21 128/74  10/31/20 134/72  02/07/20 135/84    Physical Exam Vitals and  nursing note reviewed.  Constitutional:      General: She is not in acute distress.    Appearance: She is well-developed.  HENT:     Head: Normocephalic and atraumatic.     Right Ear: Tympanic membrane and ear canal normal.     Left Ear: Tympanic membrane and ear canal normal.     Nose:     Right Sinus: No maxillary sinus tenderness.     Left Sinus: No maxillary sinus tenderness.  Eyes:     General: No scleral icterus.       Right eye: No discharge.        Left eye: No discharge.     Conjunctiva/sclera: Conjunctivae normal.  Neck:     Thyroid: No thyromegaly.     Vascular: No carotid bruit.  Cardiovascular:     Rate and Rhythm: Normal rate and regular rhythm.     Pulses: Normal pulses.     Heart sounds: Normal heart sounds.  Pulmonary:     Effort: Pulmonary  effort is normal. No respiratory distress.     Breath sounds: No wheezing.  Chest:     Comments: Breast exam deferred by patient Abdominal:     General: Bowel sounds are normal.     Palpations: Abdomen is soft.     Tenderness: There is no abdominal tenderness.  Musculoskeletal:     Cervical back: Normal range of motion. No erythema.     Right hip: Normal range of motion.     Left hip: Decreased range of motion.     Right lower leg: No edema.     Left lower leg: No edema.  Lymphadenopathy:     Cervical: No cervical adenopathy.  Skin:    General: Skin is warm and dry.     Findings: No rash.  Neurological:     Mental Status: She is alert and oriented to person, place, and time.     Cranial Nerves: No cranial nerve deficit.     Sensory: No sensory deficit.     Deep Tendon Reflexes: Reflexes are normal and symmetric.  Psychiatric:        Attention and Perception: Attention normal.        Mood and Affect: Mood normal.    Wt Readings from Last 3 Encounters:  11/06/21 157 lb 9.6 oz (71.5 kg)  10/31/20 155 lb (70.3 kg)  02/07/20 160 lb (72.6 kg)    BP 128/74    Pulse 86    Ht 5\' 6"  (1.676 m)    Wt 157 lb 9.6 oz  (71.5 kg)    SpO2 98%    BMI 25.44 kg/m   Assessment and Plan: 1. Annual physical exam Normal exam. Up to date on screenings and immunizations.  2. Encounter for screening mammogram for breast cancer Schedule at [facility] - MM 3D SCREEN BREAST BILATERAL  3. Colon cancer screening - Cologuard  4. Acquired hypothyroidism supplemented - TSH + free T4  5. Mixed hyperlipidemia Continue low fat diet, more regular exercise - Comprehensive metabolic panel - Lipid panel  6. Prediabetes - CBC with Differential/Platelet - Hemoglobin A1c  7. Osteopenia of multiple sites Continue calcium and vitamin Regular exercise.  Dexa deferred this year. Intolerant of Fosamax - VITAMIN D 25 Hydroxy (Vit-D Deficiency, Fractures)   Partially dictated using [facility]. Any errors are unintentional.  Animal nutritionist, MD [facility]  11/06/2021

## 2021-11-07 LAB — COMPREHENSIVE METABOLIC PANEL
ALT: 28 IU/L (ref 0–32)
AST: 25 IU/L (ref 0–40)
Albumin/Globulin Ratio: 1.9 (ref 1.2–2.2)
Albumin: 4.6 g/dL (ref 3.7–4.7)
Alkaline Phosphatase: 123 IU/L — ABNORMAL HIGH (ref 44–121)
BUN/Creatinine Ratio: 26 (ref 12–28)
BUN: 24 mg/dL (ref 8–27)
Bilirubin Total: 0.4 mg/dL (ref 0.0–1.2)
CO2: 22 mmol/L (ref 20–29)
Calcium: 9.6 mg/dL (ref 8.7–10.3)
Chloride: 102 mmol/L (ref 96–106)
Creatinine, Ser: 0.91 mg/dL (ref 0.57–1.00)
Globulin, Total: 2.4 g/dL (ref 1.5–4.5)
Glucose: 133 mg/dL — ABNORMAL HIGH (ref 70–99)
Potassium: 4.8 mmol/L (ref 3.5–5.2)
Sodium: 141 mmol/L (ref 134–144)
Total Protein: 7 g/dL (ref 6.0–8.5)
eGFR: 64 mL/min/{1.73_m2} (ref >59)

## 2021-11-07 LAB — CBC WITH DIFFERENTIAL/PLATELET
Basophils Absolute: 0.1 10*3/uL (ref 0.0–0.2)
Basos: 1 %
EOS (ABSOLUTE): 0.1 10*3/uL (ref 0.0–0.4)
Eos: 2 %
Hematocrit: 38.1 % (ref 34.0–46.6)
Hemoglobin: 12.5 g/dL (ref 11.1–15.9)
Immature Grans (Abs): 0 10*3/uL (ref 0.0–0.1)
Immature Granulocytes: 0 %
Lymphocytes Absolute: 1.4 10*3/uL (ref 0.7–3.1)
Lymphs: 29 %
MCH: 29.4 pg (ref 26.6–33.0)
MCHC: 32.8 g/dL (ref 31.5–35.7)
MCV: 90 fL (ref 79–97)
Monocytes Absolute: 0.4 10*3/uL (ref 0.1–0.9)
Monocytes: 8 %
Neutrophils Absolute: 2.8 10*3/uL (ref 1.4–7.0)
Neutrophils: 60 %
Platelets: 253 10*3/uL (ref 150–450)
RBC: 4.25 x10E6/uL (ref 3.77–5.28)
RDW: 11.9 % (ref 11.7–15.4)
WBC: 4.7 10*3/uL (ref 3.4–10.8)

## 2021-11-07 LAB — LIPID PANEL
Chol/HDL Ratio: 2.2 ratio (ref 0.0–4.4)
Cholesterol, Total: 202 mg/dL — ABNORMAL HIGH (ref 100–199)
HDL: 93 mg/dL (ref >39)
LDL Chol Calc (NIH): 81 mg/dL (ref 0–99)
Triglycerides: 174 mg/dL — ABNORMAL HIGH (ref 0–149)
VLDL Cholesterol Cal: 28 mg/dL (ref 5–40)

## 2021-11-07 LAB — TSH+FREE T4
Free T4: 1.18 ng/dL (ref 0.82–1.77)
TSH: 2.37 u[IU]/mL (ref 0.450–4.500)

## 2021-11-07 LAB — HEMOGLOBIN A1C
Est. average glucose Bld gHb Est-mCnc: 143 mg/dL
Hgb A1c MFr Bld: 6.6 % — ABNORMAL HIGH (ref 4.8–5.6)

## 2021-11-07 LAB — VITAMIN D 25 HYDROXY (VIT D DEFICIENCY, FRACTURES): Vit D, 25-Hydroxy: 29 ng/mL — ABNORMAL LOW (ref 30.0–100.0)

## 2021-11-25 DIAGNOSIS — M955 Acquired deformity of pelvis: Secondary | ICD-10-CM | POA: Diagnosis not present

## 2021-11-25 DIAGNOSIS — M9903 Segmental and somatic dysfunction of lumbar region: Secondary | ICD-10-CM | POA: Diagnosis not present

## 2021-11-25 DIAGNOSIS — M9905 Segmental and somatic dysfunction of pelvic region: Secondary | ICD-10-CM | POA: Diagnosis not present

## 2021-11-25 DIAGNOSIS — M5136 Other intervertebral disc degeneration, lumbar region: Secondary | ICD-10-CM | POA: Diagnosis not present

## 2021-11-25 DIAGNOSIS — M546 Pain in thoracic spine: Secondary | ICD-10-CM | POA: Diagnosis not present

## 2021-11-25 DIAGNOSIS — M9902 Segmental and somatic dysfunction of thoracic region: Secondary | ICD-10-CM | POA: Diagnosis not present

## 2021-12-03 LAB — COLOGUARD

## 2021-12-11 ENCOUNTER — Telehealth: Payer: Self-pay | Admitting: Internal Medicine

## 2021-12-11 NOTE — Telephone Encounter (Signed)
Copied from CRM [id number]. Topic: Medicare AWV >> Dec 11, 2021  9:39 AM Claudette Laws R wrote: Reason for CRM:  Left message for patient to call back and schedule Medicare Annual Wellness Visit (AWV) in office.   If unable to come into the office for AWV,  please offer to do virtually or by telephone.  Last AWV: 08/05/2020  Please schedule at anytime with [facility] Health Advisor.      40 Minutes appointment   Any questions, please call me at [phone]

## 2021-12-16 DIAGNOSIS — M5136 Other intervertebral disc degeneration, lumbar region: Secondary | ICD-10-CM | POA: Diagnosis not present

## 2021-12-16 DIAGNOSIS — M9902 Segmental and somatic dysfunction of thoracic region: Secondary | ICD-10-CM | POA: Diagnosis not present

## 2021-12-16 DIAGNOSIS — M9905 Segmental and somatic dysfunction of pelvic region: Secondary | ICD-10-CM | POA: Diagnosis not present

## 2021-12-16 DIAGNOSIS — M546 Pain in thoracic spine: Secondary | ICD-10-CM | POA: Diagnosis not present

## 2021-12-16 DIAGNOSIS — M9903 Segmental and somatic dysfunction of lumbar region: Secondary | ICD-10-CM | POA: Diagnosis not present

## 2021-12-16 DIAGNOSIS — M955 Acquired deformity of pelvis: Secondary | ICD-10-CM | POA: Diagnosis not present

## 2022-01-06 DIAGNOSIS — M546 Pain in thoracic spine: Secondary | ICD-10-CM | POA: Diagnosis not present

## 2022-01-06 DIAGNOSIS — M9905 Segmental and somatic dysfunction of pelvic region: Secondary | ICD-10-CM | POA: Diagnosis not present

## 2022-01-06 DIAGNOSIS — M9902 Segmental and somatic dysfunction of thoracic region: Secondary | ICD-10-CM | POA: Diagnosis not present

## 2022-01-06 DIAGNOSIS — M9903 Segmental and somatic dysfunction of lumbar region: Secondary | ICD-10-CM | POA: Diagnosis not present

## 2022-01-06 DIAGNOSIS — M955 Acquired deformity of pelvis: Secondary | ICD-10-CM | POA: Diagnosis not present

## 2022-01-06 DIAGNOSIS — M5136 Other intervertebral disc degeneration, lumbar region: Secondary | ICD-10-CM | POA: Diagnosis not present

## 2022-01-17 ENCOUNTER — Other Ambulatory Visit: Payer: Self-pay | Admitting: Internal Medicine

## 2022-01-17 DIAGNOSIS — E039 Hypothyroidism, unspecified: Secondary | ICD-10-CM

## 2022-01-19 NOTE — Telephone Encounter (Signed)
Requested Prescriptions  Pending Prescriptions Disp Refills   levothyroxine (SYNTHROID) 100 MCG tablet [Pharmacy Med Name: LEVOTHYROXINE 0.100MG  ( ) TAB] 90 tablet 3    Sig: TAKE 1 TABLET(100 MCG) BY MOUTH DAILY     Endocrinology:  Hypothyroid Agents Passed - 01/17/2022  6:21 AM      Passed - TSH in normal range and within 360 days    TSH  Date Value Ref Range Status  11/06/2021 2.370 0.450 - 4.500 uIU/mL Final         Passed - Valid encounter within last 12 months    Recent Outpatient Visits          2 months ago Annual physical exam   [facility] Reubin Milan, MD   1 year ago Annual physical exam   [facility] Reubin Milan, MD   1 year ago Traumatic hematoma of left hand, initial encounter   [facility] Reubin Milan, MD   2 years ago Annual physical exam   [facility] Reubin Milan, MD   3 years ago Annual physical exam   [facility] Reubin Milan, MD      Future Appointments            In 9 months Reubin Milan, MD [facility]            atorvastatin (LIPITOR) 40 MG tablet [Pharmacy Med Name: ATORVASTATIN 40MG  TABLETS] 90 tablet 3    Sig: TAKE 1 TABLET BY MOUTH EVERY DAY     Cardiovascular:  Antilipid - Statins Failed - 01/17/2022  6:21 AM      Failed - Lipid Panel in normal range within the last 12 months    Cholesterol, Total  Date Value Ref Range Status  11/06/2021 202 (H) 100 - 199 mg/dL Final   LDL Chol Calc (NIH)  Date Value Ref Range Status  11/06/2021 81 0 - 99 mg/dL Final   HDL  Date Value Ref Range Status  11/06/2021 93 >39 mg/dL Final   Triglycerides  Date Value Ref Range Status  11/06/2021 174 (H) 0 - 149 mg/dL Final         Passed - Patient is not pregnant      Passed - Valid encounter within last 12 months    Recent Outpatient Visits          2 months ago Annual physical exam   [facility], MD   1 year ago  Annual physical exam   [facility], MD   1 year ago Traumatic hematoma of left hand, initial encounter   [facility], MD   2 years ago Annual physical exam   [facility], MD   3 years ago Annual physical exam   [facility], MD      Future Appointments            In 9 months Reubin Milan Judithann Graves, MD [facility], [facility]

## 2022-01-20 DIAGNOSIS — M5136 Other intervertebral disc degeneration, lumbar region: Secondary | ICD-10-CM | POA: Diagnosis not present

## 2022-01-20 DIAGNOSIS — M9905 Segmental and somatic dysfunction of pelvic region: Secondary | ICD-10-CM | POA: Diagnosis not present

## 2022-01-20 DIAGNOSIS — M546 Pain in thoracic spine: Secondary | ICD-10-CM | POA: Diagnosis not present

## 2022-01-20 DIAGNOSIS — M9903 Segmental and somatic dysfunction of lumbar region: Secondary | ICD-10-CM | POA: Diagnosis not present

## 2022-01-20 DIAGNOSIS — M955 Acquired deformity of pelvis: Secondary | ICD-10-CM | POA: Diagnosis not present

## 2022-01-20 DIAGNOSIS — M9902 Segmental and somatic dysfunction of thoracic region: Secondary | ICD-10-CM | POA: Diagnosis not present

## 2022-01-26 ENCOUNTER — Ambulatory Visit
Admission: RE | Admit: 2022-01-26 | Discharge: 2022-01-26 | Disposition: A | Discharge status: Home / Self Care | Payer: Medicare Other | Source: Ambulatory Visit | Attending: Internal Medicine | Admitting: Internal Medicine

## 2022-01-26 ENCOUNTER — Other Ambulatory Visit: Payer: Self-pay

## 2022-01-26 DIAGNOSIS — Z1231 Encounter for screening mammogram for malignant neoplasm of breast: Secondary | ICD-10-CM | POA: Insufficient documentation

## 2022-01-27 ENCOUNTER — Other Ambulatory Visit: Payer: Self-pay | Admitting: Internal Medicine

## 2022-01-27 DIAGNOSIS — R921 Mammographic calcification found on diagnostic imaging of breast: Secondary | ICD-10-CM

## 2022-01-27 DIAGNOSIS — R928 Other abnormal and inconclusive findings on diagnostic imaging of breast: Secondary | ICD-10-CM

## 2022-02-10 DIAGNOSIS — M9903 Segmental and somatic dysfunction of lumbar region: Secondary | ICD-10-CM | POA: Diagnosis not present

## 2022-02-10 DIAGNOSIS — M546 Pain in thoracic spine: Secondary | ICD-10-CM | POA: Diagnosis not present

## 2022-02-10 DIAGNOSIS — M5136 Other intervertebral disc degeneration, lumbar region: Secondary | ICD-10-CM | POA: Diagnosis not present

## 2022-02-10 DIAGNOSIS — M955 Acquired deformity of pelvis: Secondary | ICD-10-CM | POA: Diagnosis not present

## 2022-02-10 DIAGNOSIS — M9902 Segmental and somatic dysfunction of thoracic region: Secondary | ICD-10-CM | POA: Diagnosis not present

## 2022-02-10 DIAGNOSIS — M9905 Segmental and somatic dysfunction of pelvic region: Secondary | ICD-10-CM | POA: Diagnosis not present

## 2022-02-11 ENCOUNTER — Telehealth: Payer: Self-pay | Admitting: Internal Medicine

## 2022-02-11 NOTE — Telephone Encounter (Signed)
Copied from CRM [id number]. Topic: Medicare AWV ?>> Feb 11, 2022 10:08 AM Claudette Laws R wrote: ?Reason for CRM:  ?Left message for patient to call back and schedule Medicare Annual Wellness Visit (AWV) in office.  ? ?If unable to come into the office for AWV,  please offer to do virtually or by telephone. ? ?Last AWV: 08/05/2020 ? ?Please schedule at anytime with [facility] Health Advisor.     ? ?40 Minutes appointment  ? ?Any questions, please call me at [phone] ?

## 2022-02-17 ENCOUNTER — Other Ambulatory Visit: Payer: Self-pay

## 2022-02-17 ENCOUNTER — Ambulatory Visit
Admission: RE | Admit: 2022-02-17 | Discharge: 2022-02-17 | Disposition: A | Discharge status: Home / Self Care | Payer: Medicare Other | Source: Ambulatory Visit | Attending: Internal Medicine | Admitting: Internal Medicine

## 2022-02-17 DIAGNOSIS — R928 Other abnormal and inconclusive findings on diagnostic imaging of breast: Secondary | ICD-10-CM | POA: Diagnosis not present

## 2022-02-17 DIAGNOSIS — R922 Inconclusive mammogram: Secondary | ICD-10-CM | POA: Diagnosis not present

## 2022-02-17 DIAGNOSIS — R921 Mammographic calcification found on diagnostic imaging of breast: Secondary | ICD-10-CM | POA: Diagnosis not present

## 2022-03-03 DIAGNOSIS — M9905 Segmental and somatic dysfunction of pelvic region: Secondary | ICD-10-CM | POA: Diagnosis not present

## 2022-03-03 DIAGNOSIS — M9902 Segmental and somatic dysfunction of thoracic region: Secondary | ICD-10-CM | POA: Diagnosis not present

## 2022-03-03 DIAGNOSIS — M5136 Other intervertebral disc degeneration, lumbar region: Secondary | ICD-10-CM | POA: Diagnosis not present

## 2022-03-03 DIAGNOSIS — M9903 Segmental and somatic dysfunction of lumbar region: Secondary | ICD-10-CM | POA: Diagnosis not present

## 2022-03-03 DIAGNOSIS — M546 Pain in thoracic spine: Secondary | ICD-10-CM | POA: Diagnosis not present

## 2022-03-03 DIAGNOSIS — M955 Acquired deformity of pelvis: Secondary | ICD-10-CM | POA: Diagnosis not present

## 2022-03-17 ENCOUNTER — Telehealth: Payer: Self-pay | Admitting: Internal Medicine

## 2022-03-17 NOTE — Telephone Encounter (Signed)
Copied from CRM [id number]. Topic: Medicare AWV ?>> Mar 17, 2022  2:02 PM Claudette Laws R wrote: ?Reason for CRM:  ?Left message for patient to call back and schedule Medicare Annual Wellness Visit (AWV) in office.  ? ?If unable to come into the office for AWV,  please offer to do virtually or by telephone. ? ?Last AWV:  08/05/2020 ? ?Please schedule at anytime with [facility] Health Advisor.     ? ?30 minute appointment for Virtual or phone ?45 minute appointment for in office or Initial virtual/phone ? ?Any questions, please call me at [phone] ?

## 2022-03-20 ENCOUNTER — Telehealth: Payer: Self-pay | Admitting: Internal Medicine

## 2022-03-20 NOTE — Telephone Encounter (Signed)
Called pt left her know that we have her results for her Cologuard that she completed. Let her know that she is good for 3 years. Told pt she could try to send it back or call the number that is listed on the box to see how she can return it. Pt verbalized understanding. ? ?KP ?

## 2022-03-20 NOTE — Telephone Encounter (Signed)
Copied from [place] [phone]. Topic: General - Other ?>> Mar 20, 2022  3:13 PM Erick Blinks wrote: ?Reason for CRM: Pt says she has received a cologuard kit and wants to know why? She says that she already submitted her samples after christmas ? ?Best contact: [phone] ?

## 2022-03-24 DIAGNOSIS — M9905 Segmental and somatic dysfunction of pelvic region: Secondary | ICD-10-CM | POA: Diagnosis not present

## 2022-03-24 DIAGNOSIS — M5136 Other intervertebral disc degeneration, lumbar region: Secondary | ICD-10-CM | POA: Diagnosis not present

## 2022-03-24 DIAGNOSIS — M9902 Segmental and somatic dysfunction of thoracic region: Secondary | ICD-10-CM | POA: Diagnosis not present

## 2022-03-24 DIAGNOSIS — M546 Pain in thoracic spine: Secondary | ICD-10-CM | POA: Diagnosis not present

## 2022-03-24 DIAGNOSIS — M955 Acquired deformity of pelvis: Secondary | ICD-10-CM | POA: Diagnosis not present

## 2022-03-24 DIAGNOSIS — M9903 Segmental and somatic dysfunction of lumbar region: Secondary | ICD-10-CM | POA: Diagnosis not present

## 2022-04-01 ENCOUNTER — Ambulatory Visit (INDEPENDENT_AMBULATORY_CARE_PROVIDER_SITE_OTHER): Payer: Medicare Other

## 2022-04-01 DIAGNOSIS — Z0000 Encounter for general adult medical examination without abnormal findings: Secondary | ICD-10-CM | POA: Diagnosis not present

## 2022-04-01 NOTE — Progress Notes (Signed)
? ?Subjective:  ? Kelsey Pacheco is a 81 y.o. female who presents for Medicare Annual (Subsequent) preventive examination. ? ?Virtual Visit via Telephone Note ? ?I connected with  Kelsey Pacheco on 04/01/22 at  9:15 AM EDT by telephone and verified that I am speaking with the correct person using two identifiers. ? ?Location: ?Patient: home ?Provider: [facility] ?Persons participating in the virtual visit: patient/Nurse Health Advisor ?  ?I discussed the limitations, risks, security and privacy concerns of performing an evaluation and management service by telephone and the availability of in person appointments. The patient expressed understanding and agreed to proceed. ? ?Interactive audio and video telecommunications were attempted between this nurse and patient, however failed, due to patient having technical difficulties OR patient did not have access to video capability.  We continued and completed visit with audio only. ? ?Some vital signs may be absent or patient reported.  ? ?Reather Littler, LPN ? ? ?Review of Systems    ? ?Cardiac Risk Factors include: advanced age (>46men, >76 women);dyslipidemia ? ?   ?Objective:  ?  ?There were no vitals filed for this visit. ?There is no height or weight on file to calculate BMI. ? ? ?  04/01/2022  ?  9:24 AM 08/05/2020  ?  1:28 PM 03/16/2018  ?  7:24 AM 09/06/2017  ?  8:33 AM 11/20/2015  ?  9:17 AM  ?Advanced Directives  ?Does Patient Have a Medical Advance Directive? Yes Yes Yes Yes Yes  ?Type of Estate agent of [place];Living will Healthcare Power of [place];Living will Healthcare Power of [place];Living will Healthcare Power of [place];Living will Living will;Healthcare Power of Attorney  ?Copy of Healthcare Power of Attorney in Chart? Yes - validated most recent copy scanned in chart (See row information) Yes - validated most recent copy scanned in chart (See row information) Yes No - copy requested   ? ? ?Current Medications  (verified) ?Outpatient Encounter Medications as of 04/01/2022  ?Medication Sig  ? Ascorbic Acid (VITAMIN C) 100 MG tablet Take 1 tablet by mouth daily.  ? aspirin 81 MG tablet Take 1 tablet by mouth daily.  ? atorvastatin (LIPITOR) 40 MG tablet TAKE 1 TABLET BY MOUTH EVERY DAY  ? Calcium Carbonate (CALCIUM 600 PO) Take 1 tablet by mouth daily.  ? levothyroxine (SYNTHROID) 100 MCG tablet TAKE 1 TABLET(100 MCG) BY MOUTH DAILY  ? MULTIPLE VITAMIN PO Take 1 tablet by mouth daily.  ? Vitamin D, Cholecalciferol, 1000 UNITS TABS Take 1 tablet by mouth daily.  ? Vitamin E 100 UNITS TABS Take 1 capsule by mouth daily.  ? ?No facility-administered encounter medications on file as of 04/01/2022.  ? ? ?Allergies (verified) ?Fosamax [alendronate]  ? ?History: ?Past Medical History:  ?Diagnosis Date  ? 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 Hyperlipidemia   ? Hypothyroidism   ? Osteoporosis   ? Sciatica of left side   ? Thyroid disease   ? Vitamin D deficiency   ? ?Past Surgical History:  ?Procedure Laterality Date  ? BASAL CELL CARCINOMA EXCISION  06/04/11   ? [facility] (Mohs procedure) left orbital Dr. Cheryll Dessert  ? BREAST BIOPSY Right 2006  ? bx/clip-neg  ? CATARACT EXTRACTION W/PHACO Right 03/16/2018  ? Procedure: CATARACT EXTRACTION PHACO AND INTRAOCULAR LENS PLACEMENT (IOC) RIGHT;  Surgeon: Lockie Mola, MD;  Location: [facility];  Service: Ophthalmology;  Laterality: Right;  requests early  ? Cologuard home test  12/2015  ? Negative  ? RETINAL DETACHMENT SURGERY  Right 2018  ? ?Family History  ?Problem Relation Age of Onset  ? Dementia Father   ? Heart disease Father   ? Heart attack Mother   ? Breast cancer Neg Hx   ? ?Social History  ? ?Socioeconomic History  ? Marital status: Widowed  ?  Spouse name: Not on file  ? Number of children: 1  ? Years of education: HS  ? Highest education level: Not on file  ?Occupational History  ?  Comment: RETIRED  ?Tobacco Use  ? Smoking status: Never  ? Smokeless tobacco: Never  ?Vaping Use  ?  Vaping Use: Never used  ?Substance and Sexual Activity  ? Alcohol use: No  ? Drug use: No  ? Sexual activity: Never  ?Other Topics Concern  ? Not on file  ?Social History Narrative  ? Pt lives alone  ? ?Social Determinants of Health  ? ?Financial Resource Strain: Low Risk   ? Difficulty of Paying Living Expenses: Not hard at all  ?Food Insecurity: No Food Insecurity  ? Worried About Programme researcher, broadcasting/film/videounning Out of Food in the Last Year: Never true  ? Ran Out of Food in the Last Year: Never true  ?Transportation Needs: No Transportation Needs  ? Lack of Transportation (Medical): No  ? Lack of Transportation (Non-Medical): No  ?Physical Activity: Insufficiently Active  ? Days of Exercise per Week: 3 days  ? Minutes of Exercise per Session: 20 min  ?Stress: No Stress Concern Present  ? Feeling of Stress : Not at all  ?Social Connections: Socially Isolated  ? Frequency of Communication with Friends and Family: More than three times a week  ? Frequency of Social Gatherings with Friends and Family: More than three times a week  ? Attends Religious Services: Never  ? Active Member of Clubs or Organizations: No  ? Attends BankerClub or Organization Meetings: Never  ? Marital Status: Widowed  ? ? ?Tobacco Counseling ?Counseling given: Not Answered ? ? ?Clinical Intake: ? ?Pre-visit preparation completed: Yes ? ?Pain : No/denies pain ? ?  ? ?Nutritional Risks: None ?Diabetes: No ? ?How often do you need to have someone help you when you read instructions, pamphlets, or other written materials from your doctor or pharmacy?: 1 - Never ? ? ? ?Interpreter Needed?: No ? ?Information entered by :: Reather LittlerKasey Brinden Kincheloe LPN ? ? ?Activities of Daily Living ? ?  04/01/2022  ?  9:25 AM 11/06/2021  ?  8:15 AM  ?In your present state of health, do you have any difficulty performing the following activities:  ?Hearing? 0 0  ?Vision? 0 0  ?Difficulty concentrating or making decisions? 0 0  ?Walking or climbing stairs? 0 1  ?Dressing or bathing? 0 0  ?Doing errands,  shopping? 0 0  ?Preparing Food and eating ? N   ?Using the Toilet? N   ?In the past six months, have you accidently leaked urine? N   ?Do you have problems with loss of bowel control? N   ?Managing your Medications? N   ?Managing your Finances? N   ?Housekeeping or managing your Housekeeping? N   ? ? ?Patient Care Team: ?Reubin MilanBerglund, Laura H, MD as PCP - General (Internal Medicine) ? ?Indicate any recent Medical Services you may have received from other than [facility] providers in the past year (date may be approximate). ? ?   ?Assessment:  ? [facility] ? ?Hearing/Vision screen ?Hearing Screening - Comments:: Pt denies hearing difficulty ?Vision Screening - Comments::  Sees Dr. Dewaine Conger for annual eye exams ? ?Dietary issues and exercise activities discussed: ?Current Exercise Habits: Home exercise routine, Type of exercise: treadmill;Other - see comments (stationary bike), Time (Minutes): 20, Frequency (Times/Week): 3, Weekly Exercise (Minutes/Week): 60, Intensity: Moderate, Exercise limited by: neurologic condition(s) ? ? Goals Addressed   ? ?  ?  ?  ?  ? This Visit's Progress  ?  Eat more fruits and vegetables   On track  ?  Recommend to increase fruit and vegetable servings to 5 per day ? ?  ? ?  ? ?Depression Screen ? ?  04/01/2022  ?  9:23 AM 11/06/2021  ?  8:14 AM 10/31/2020  ?  8:15 AM 08/05/2020  ?  1:26 PM 02/07/2020  ? 10:25 AM 10/30/2019  ?  8:23 AM 10/27/2018  ?  8:25 AM  ?PHQ 2/9 Scores  ?PHQ - 2 Score 0 0 0 0 0 1 0  ?PHQ- 9 Score  1 0   4   ?  ?Fall Risk ? ?  04/01/2022  ?  9:24 AM 11/06/2021  ?  8:15 AM 10/31/2020  ?  8:15 AM 08/05/2020  ?  1:28 PM 10/30/2019  ?  8:23 AM  ?Fall Risk   ?Falls in the past year? 0 0 0 0 0  ?Number falls in past yr: 0 0  0 0  ?Injury with Fall? 0 0  0 0  ?Risk for fall due to : No Fall Risks No Fall Risks  No Fall Risks   ?Follow up Falls prevention discussed Falls evaluation completed Falls evaluation completed Falls prevention discussed Falls  evaluation completed  ? ? ?FALL RISK PREVENTION PERTAINING TO THE HOME: ? ?Any stairs in or around the home? Yes  ?If so, are there any without handrails? No  ?Home free of loose throw rugs in walkways, pet beds, electrical c

## 2022-04-01 NOTE — Patient Instructions (Signed)
Kelsey Pacheco , ?Thank you for taking time to come for your Medicare Wellness Visit. I appreciate your ongoing commitment to your health goals. Please review the following plan we discussed and let me know if I can assist you in the future.  ? ?Screening recommendations/referrals: ?Colonoscopy: no longer required ?Mammogram: done 01/26/22 ?Bone Density: done 01/17/20 ?Recommended yearly ophthalmology/optometry visit for glaucoma screening and checkup ?Recommended yearly dental visit for hygiene and checkup ? ?Vaccinations: ?Influenza vaccine: done 09/12/21 ?Pneumococcal vaccine: done 04/23/14 ?Tdap vaccine: due ?Shingles vaccine: Shingrix discussed. Please contact your pharmacy for coverage information.    ?Covid-19:done 01/06/20, 02/04/20 & 09/26/20 ? ?Conditions/risks identified: Keep up the great work! ? ?Next appointment: Follow up in one year for your annual wellness visit  ? ? ?Preventive Care 20 Years and Older, Female ?Preventive care refers to lifestyle choices and visits with your health care provider that can promote health and wellness. ?What does preventive care include? ?A yearly physical exam. This is also called an annual well check. ?Dental exams once or twice a year. ?Routine eye exams. Ask your health care provider how often you should have your eyes checked. ?Personal lifestyle choices, including: ?Daily care of your teeth and gums. ?Regular physical activity. ?Eating a healthy diet. ?Avoiding tobacco and drug use. ?Limiting alcohol use. ?Practicing safe sex. ?Taking low-dose aspirin every day. ?Taking vitamin and mineral supplements as recommended by your health care provider. ?What happens during an annual well check? ?The services and screenings done by your health care provider during your annual well check will depend on your age, overall health, lifestyle risk factors, and family history of disease. ?Counseling  ?Your health care provider may ask you questions about your: ?Alcohol use. ?Tobacco  use. ?Drug use. ?Emotional well-being. ?Home and relationship well-being. ?Sexual activity. ?Eating habits. ?History of falls. ?Memory and ability to understand (cognition). ?Work and work Astronomer. ?Reproductive health. ?Screening  ?You may have the following tests or measurements: ?Height, weight, and BMI. ?Blood pressure. ?Lipid and cholesterol levels. These may be checked every 5 years, or more frequently if you are over 53 years old. ?Skin check. ?Lung cancer screening. You may have this screening every year starting at age 62 if you have a 30-pack-year history of smoking and currently smoke or have quit within the past 15 years. ?Fecal occult blood test (FOBT) of the stool. You may have this test every year starting at age 55. ?Flexible sigmoidoscopy or colonoscopy. You may have a sigmoidoscopy every 5 years or a colonoscopy every 10 years starting at age 33. ?Hepatitis C blood test. ?Hepatitis B blood test. ?Sexually transmitted disease (STD) testing. ?Diabetes screening. This is done by checking your blood sugar (glucose) after you have not eaten for a while (fasting). You may have this done every 1-3 years. ?Bone density scan. This is done to screen for osteoporosis. You may have this done starting at age 56. ?Mammogram. This may be done every 1-2 years. Talk to your health care provider about how often you should have regular mammograms. ?Talk with your health care provider about your test results, treatment options, and if necessary, the need for more tests. ?Vaccines  ?Your health care provider may recommend certain vaccines, such as: ?Influenza vaccine. This is recommended every year. ?Tetanus, diphtheria, and acellular pertussis (Tdap, Td) vaccine. You may need a Td booster every 10 years. ?Zoster vaccine. You may need this after age 63. ?Pneumococcal 13-valent conjugate (PCV13) vaccine. One dose is recommended after age 42. ?Pneumococcal polysaccharide (PPSV23) vaccine.  One dose is recommended  after age 81. ?Talk to your health care provider about which screenings and vaccines you need and how often you need them. ?This information is not intended to replace advice given to you by your health care provider. Make sure you discuss any questions you have with your health care provider. ?Document Released: 12/06/2015 Document Revised: 07/29/2016 Document Reviewed: 09/10/2015 ?Elsevier Interactive Patient Education ? 2017 [place]. ? ?Fall Prevention in the Home ?Falls can cause injuries. They can happen to people of all ages. There are many things you can do to make your home safe and to help prevent falls. ?What can I do on the outside of my home? ?Regularly fix the edges of walkways and driveways and fix any cracks. ?Remove anything that might make you trip as you walk through a door, such as a raised step or threshold. ?Trim any bushes or trees on the path to your home. ?Use bright outdoor lighting. ?Clear any walking paths of anything that might make someone trip, such as rocks or tools. ?Regularly check to see if handrails are loose or broken. Make sure that both sides of any steps have handrails. ?Any raised decks and porches should have guardrails on the edges. ?Have any leaves, snow, or ice cleared regularly. ?Use sand or salt on walking paths during winter. ?Clean up any spills in your garage right away. This includes oil or grease spills. ?What can I do in the bathroom? ?Use night lights. ?Install grab bars by the toilet and in the tub and shower. Do not use towel bars as grab bars. ?Use non-skid mats or decals in the tub or shower. ?If you need to sit down in the shower, use a plastic, non-slip stool. ?Keep the floor dry. Clean up any water that spills on the floor as soon as it happens. ?Remove soap buildup in the tub or shower regularly. ?Attach bath mats securely with double-sided non-slip rug tape. ?Do not have throw rugs and other things on the floor that can make you trip. ?What can I do  in the bedroom? ?Use night lights. ?Make sure that you have a light by your bed that is easy to reach. ?Do not use any sheets or blankets that are too big for your bed. They should not hang down onto the floor. ?Have a firm chair that has side arms. You can use this for support while you get dressed. ?Do not have throw rugs and other things on the floor that can make you trip. ?What can I do in the kitchen? ?Clean up any spills right away. ?Avoid walking on wet floors. ?Keep items that you use a lot in easy-to-reach places. ?If you need to reach something above you, use a strong step stool that has a grab bar. ?Keep electrical cords out of the way. ?Do not use floor polish or wax that makes floors slippery. If you must use wax, use non-skid floor wax. ?Do not have throw rugs and other things on the floor that can make you trip. ?What can I do with my stairs? ?Do not leave any items on the stairs. ?Make sure that there are handrails on both sides of the stairs and use them. Fix handrails that are broken or loose. Make sure that handrails are as long as the stairways. ?Check any carpeting to make sure that it is firmly attached to the stairs. Fix any carpet that is loose or worn. ?Avoid having throw rugs at the top or bottom of  the stairs. If you do have throw rugs, attach them to the floor with carpet tape. ?Make sure that you have a light switch at the top of the stairs and the bottom of the stairs. If you do not have them, ask someone to add them for you. ?What else can I do to help prevent falls? ?Wear shoes that: ?Do not have high heels. ?Have rubber bottoms. ?Are comfortable and fit you well. ?Are closed at the toe. Do not wear sandals. ?If you use a stepladder: ?Make sure that it is fully opened. Do not climb a closed stepladder. ?Make sure that both sides of the stepladder are locked into place. ?Ask someone to hold it for you, if possible. ?Clearly mark and make sure that you can see: ?Any grab bars or  handrails. ?First and last steps. ?Where the edge of each step is. ?Use tools that help you move around (mobility aids) if they are needed. These include: ?Canes. ?Walkers. ?Scooters. ?Crutches. ?Turn on the lights wh

## 2022-04-14 DIAGNOSIS — M9903 Segmental and somatic dysfunction of lumbar region: Secondary | ICD-10-CM | POA: Diagnosis not present

## 2022-04-14 DIAGNOSIS — M5136 Other intervertebral disc degeneration, lumbar region: Secondary | ICD-10-CM | POA: Diagnosis not present

## 2022-04-14 DIAGNOSIS — M9905 Segmental and somatic dysfunction of pelvic region: Secondary | ICD-10-CM | POA: Diagnosis not present

## 2022-04-14 DIAGNOSIS — M546 Pain in thoracic spine: Secondary | ICD-10-CM | POA: Diagnosis not present

## 2022-04-14 DIAGNOSIS — M9902 Segmental and somatic dysfunction of thoracic region: Secondary | ICD-10-CM | POA: Diagnosis not present

## 2022-04-14 DIAGNOSIS — M955 Acquired deformity of pelvis: Secondary | ICD-10-CM | POA: Diagnosis not present

## 2022-05-05 DIAGNOSIS — M9902 Segmental and somatic dysfunction of thoracic region: Secondary | ICD-10-CM | POA: Diagnosis not present

## 2022-05-05 DIAGNOSIS — M955 Acquired deformity of pelvis: Secondary | ICD-10-CM | POA: Diagnosis not present

## 2022-05-05 DIAGNOSIS — M9905 Segmental and somatic dysfunction of pelvic region: Secondary | ICD-10-CM | POA: Diagnosis not present

## 2022-05-05 DIAGNOSIS — M9903 Segmental and somatic dysfunction of lumbar region: Secondary | ICD-10-CM | POA: Diagnosis not present

## 2022-05-05 DIAGNOSIS — M5136 Other intervertebral disc degeneration, lumbar region: Secondary | ICD-10-CM | POA: Diagnosis not present

## 2022-05-05 DIAGNOSIS — M546 Pain in thoracic spine: Secondary | ICD-10-CM | POA: Diagnosis not present

## 2022-05-19 DIAGNOSIS — M9903 Segmental and somatic dysfunction of lumbar region: Secondary | ICD-10-CM | POA: Diagnosis not present

## 2022-05-19 DIAGNOSIS — M5136 Other intervertebral disc degeneration, lumbar region: Secondary | ICD-10-CM | POA: Diagnosis not present

## 2022-05-19 DIAGNOSIS — M955 Acquired deformity of pelvis: Secondary | ICD-10-CM | POA: Diagnosis not present

## 2022-05-19 DIAGNOSIS — M546 Pain in thoracic spine: Secondary | ICD-10-CM | POA: Diagnosis not present

## 2022-05-19 DIAGNOSIS — M9902 Segmental and somatic dysfunction of thoracic region: Secondary | ICD-10-CM | POA: Diagnosis not present

## 2022-05-19 DIAGNOSIS — M9905 Segmental and somatic dysfunction of pelvic region: Secondary | ICD-10-CM | POA: Diagnosis not present

## 2022-06-09 DIAGNOSIS — M9903 Segmental and somatic dysfunction of lumbar region: Secondary | ICD-10-CM | POA: Diagnosis not present

## 2022-06-09 DIAGNOSIS — M546 Pain in thoracic spine: Secondary | ICD-10-CM | POA: Diagnosis not present

## 2022-06-09 DIAGNOSIS — M5136 Other intervertebral disc degeneration, lumbar region: Secondary | ICD-10-CM | POA: Diagnosis not present

## 2022-06-09 DIAGNOSIS — M9905 Segmental and somatic dysfunction of pelvic region: Secondary | ICD-10-CM | POA: Diagnosis not present

## 2022-06-09 DIAGNOSIS — M9902 Segmental and somatic dysfunction of thoracic region: Secondary | ICD-10-CM | POA: Diagnosis not present

## 2022-06-09 DIAGNOSIS — M955 Acquired deformity of pelvis: Secondary | ICD-10-CM | POA: Diagnosis not present

## 2022-06-23 DIAGNOSIS — M9905 Segmental and somatic dysfunction of pelvic region: Secondary | ICD-10-CM | POA: Diagnosis not present

## 2022-06-23 DIAGNOSIS — M546 Pain in thoracic spine: Secondary | ICD-10-CM | POA: Diagnosis not present

## 2022-06-23 DIAGNOSIS — M9903 Segmental and somatic dysfunction of lumbar region: Secondary | ICD-10-CM | POA: Diagnosis not present

## 2022-06-23 DIAGNOSIS — M5136 Other intervertebral disc degeneration, lumbar region: Secondary | ICD-10-CM | POA: Diagnosis not present

## 2022-06-23 DIAGNOSIS — M9902 Segmental and somatic dysfunction of thoracic region: Secondary | ICD-10-CM | POA: Diagnosis not present

## 2022-06-23 DIAGNOSIS — M955 Acquired deformity of pelvis: Secondary | ICD-10-CM | POA: Diagnosis not present

## 2022-07-07 ENCOUNTER — Other Ambulatory Visit: Payer: Self-pay | Admitting: Internal Medicine

## 2022-07-07 DIAGNOSIS — R921 Mammographic calcification found on diagnostic imaging of breast: Secondary | ICD-10-CM

## 2022-07-07 DIAGNOSIS — R928 Other abnormal and inconclusive findings on diagnostic imaging of breast: Secondary | ICD-10-CM

## 2022-08-05 ENCOUNTER — Ambulatory Visit
Admission: RE | Admit: 2022-08-05 | Discharge: 2022-08-05 | Disposition: A | Discharge status: Home / Self Care | Payer: Medicare Other | Source: Ambulatory Visit | Attending: Internal Medicine | Admitting: Internal Medicine

## 2022-08-05 ENCOUNTER — Other Ambulatory Visit: Payer: Self-pay | Admitting: Internal Medicine

## 2022-08-05 DIAGNOSIS — N6489 Other specified disorders of breast: Secondary | ICD-10-CM | POA: Diagnosis not present

## 2022-08-05 DIAGNOSIS — R928 Other abnormal and inconclusive findings on diagnostic imaging of breast: Secondary | ICD-10-CM

## 2022-08-05 DIAGNOSIS — R921 Mammographic calcification found on diagnostic imaging of breast: Secondary | ICD-10-CM | POA: Diagnosis not present

## 2022-08-06 ENCOUNTER — Other Ambulatory Visit: Payer: Self-pay | Admitting: Internal Medicine

## 2022-08-06 DIAGNOSIS — R921 Mammographic calcification found on diagnostic imaging of breast: Secondary | ICD-10-CM

## 2022-08-06 DIAGNOSIS — R928 Other abnormal and inconclusive findings on diagnostic imaging of breast: Secondary | ICD-10-CM

## 2022-08-11 ENCOUNTER — Other Ambulatory Visit: Payer: Self-pay | Admitting: Internal Medicine

## 2022-08-11 DIAGNOSIS — R928 Other abnormal and inconclusive findings on diagnostic imaging of breast: Secondary | ICD-10-CM

## 2022-08-11 DIAGNOSIS — N6489 Other specified disorders of breast: Secondary | ICD-10-CM

## 2022-08-19 ENCOUNTER — Ambulatory Visit
Admission: RE | Admit: 2022-08-19 | Discharge: 2022-08-19 | Disposition: A | Discharge status: Home / Self Care | Payer: Medicare Other | Source: Ambulatory Visit | Attending: Internal Medicine | Admitting: Internal Medicine

## 2022-08-19 DIAGNOSIS — N6489 Other specified disorders of breast: Secondary | ICD-10-CM

## 2022-08-19 DIAGNOSIS — R928 Other abnormal and inconclusive findings on diagnostic imaging of breast: Secondary | ICD-10-CM | POA: Diagnosis not present

## 2022-08-19 DIAGNOSIS — Z1239 Encounter for other screening for malignant neoplasm of breast: Secondary | ICD-10-CM | POA: Diagnosis not present

## 2022-08-19 HISTORY — PX: BREAST BIOPSY: SHX20

## 2022-08-20 LAB — SURGICAL PATHOLOGY

## 2022-11-10 ENCOUNTER — Encounter: Payer: Self-pay | Admitting: Internal Medicine

## 2022-11-10 ENCOUNTER — Ambulatory Visit (INDEPENDENT_AMBULATORY_CARE_PROVIDER_SITE_OTHER): Payer: Medicare Other | Admitting: Internal Medicine

## 2022-11-10 VITALS — BP 134/74 | HR 71 | Ht 66.0 in | Wt 156.0 lb

## 2022-11-10 DIAGNOSIS — E039 Hypothyroidism, unspecified: Secondary | ICD-10-CM | POA: Diagnosis not present

## 2022-11-10 DIAGNOSIS — J069 Acute upper respiratory infection, unspecified: Secondary | ICD-10-CM | POA: Diagnosis not present

## 2022-11-10 DIAGNOSIS — Z1231 Encounter for screening mammogram for malignant neoplasm of breast: Secondary | ICD-10-CM

## 2022-11-10 DIAGNOSIS — E782 Mixed hyperlipidemia: Secondary | ICD-10-CM

## 2022-11-10 DIAGNOSIS — R7303 Prediabetes: Secondary | ICD-10-CM | POA: Diagnosis not present

## 2022-11-10 DIAGNOSIS — D649 Anemia, unspecified: Secondary | ICD-10-CM | POA: Diagnosis not present

## 2022-11-10 DIAGNOSIS — Z0000 Encounter for general adult medical examination without abnormal findings: Secondary | ICD-10-CM

## 2022-11-10 MED ORDER — LEVOTHYROXINE SODIUM 100 MCG PO TABS: 100.0000 ug | ORAL_TABLET | Freq: Every day | ORAL | 3 refills | Status: DC

## 2022-11-10 MED ORDER — ATORVASTATIN CALCIUM 40 MG PO TABS: 40.0000 mg | ORAL_TABLET | Freq: Every day | ORAL | 3 refills | Status: DC

## 2022-11-10 NOTE — Patient Instructions (Signed)
Call [facility] to schedule your mammogram at [phone].  

## 2022-11-10 NOTE — Progress Notes (Signed)
Date:  11/10/2022   Name:  Kelsey Pacheco   DOB:  Sep 28, 1941   MRN:  [id number]   Chief Complaint: Annual Exam Kelsey Pacheco Care is a 81 y.o. female who presents today for her Complete Annual Exam. She feels well. She reports exercising stationary bike and walking. She reports she is sleeping well. Breast complaints none.  Mammogram: 07/2022 bx right was negative DEXA: 12/2019 osteopenia Colonoscopy: 2007  Health Maintenance Due  Topic Date Due   Zoster Vaccines- Shingrix (1 of 2) Never done   DTaP/Tdap/Td (4 - Td or Tdap) 08/28/2021   COVID-19 Vaccine (5 - 2023-24 season) 11/14/2022    Immunization History  Administered Date(s) Administered   Fluad Quad(high Dose 65+) 09/19/2022   Influenza, High Dose Seasonal PF 09/06/2017, 09/12/2018   Influenza,inj,Quad PF,6+ Mos 09/21/2016, 09/07/2019   Influenza-Unspecified 08/23/2013, 09/07/2019, 09/03/2020, 09/12/2021   Moderna SARS-COV2 Booster Vaccination 09/19/2022   Moderna Sars-Covid-2 Vaccination 01/06/2020, 02/04/2020, 09/26/2020   Pneumococcal Conjugate-13 04/23/2014   Pneumococcal Polysaccharide-23 09/14/2007   Td 09/14/2007, 08/29/2011   Tdap 08/29/2011   Zoster, Live 05/30/2011    Diabetes She presents for her follow-up diabetic visit. 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 Her disease course has been stable. Pertinent negatives for hypoglycemia include no dizziness, headaches, nervousness/anxiousness or tremors. Pertinent negatives for diabetes include no chest pain, no fatigue, no polydipsia and no polyuria. Current diabetic treatment includes diet. An ACE inhibitor/angiotensin II receptor blocker is not being taken.  Thyroid Problem Presents for follow-up visit. Patient reports no anxiety, constipation, diarrhea, fatigue, palpitations or tremors. The symptoms have been stable. Her past medical history is significant for hyperlipidemia.  Hyperlipidemia This is a chronic problem. The problem is  controlled. Pertinent negatives include no chest pain or shortness of breath. Current antihyperlipidemic treatment includes statins. The current treatment provides significant improvement of lipids.    Lab Results  Component Value Date   NA 141 11/06/2021   K 4.8 11/06/2021   CO2 22 11/06/2021   GLUCOSE 133 (H) 11/06/2021   BUN 24 11/06/2021   CREATININE 0.91 11/06/2021   CALCIUM 9.6 11/06/2021   EGFR 64 11/06/2021   GFRNONAA 59 (L) 10/31/2020   Lab Results  Component Value Date   CHOL 202 (H) 11/06/2021   HDL 93 11/06/2021   LDLCALC 81 11/06/2021   TRIG 174 (H) 11/06/2021   CHOLHDL 2.2 11/06/2021   Lab Results  Component Value Date   TSH 2.370 11/06/2021   Lab Results  Component Value Date   HGBA1C 6.6 (H) 11/06/2021   Lab Results  Component Value Date   WBC 4.7 11/06/2021   HGB 12.5 11/06/2021   HCT 38.1 11/06/2021   MCV 90 11/06/2021   PLT 253 11/06/2021   Lab Results  Component Value Date   ALT 28 11/06/2021   AST 25 11/06/2021   ALKPHOS 123 (H) 11/06/2021   BILITOT 0.4 11/06/2021   Lab Results  Component Value Date   VD25OH 29.0 (L) 11/06/2021     Review of Systems  Constitutional:  Negative for chills, fatigue and fever.  HENT:  Negative for congestion, hearing loss, tinnitus, trouble swallowing and voice change.   Eyes:  Negative for visual disturbance.  Respiratory:  Negative for cough, chest tightness, shortness of breath and wheezing.   Cardiovascular:  Negative for chest pain, palpitations and leg swelling.  Gastrointestinal:  Negative for abdominal pain, constipation, diarrhea and vomiting.  Endocrine: Negative for polydipsia and polyuria.  Genitourinary:  Negative for  dysuria, frequency, genital sores, vaginal bleeding and vaginal discharge.  Musculoskeletal:  Positive for arthralgias and back pain. Negative for gait problem and joint swelling.  Skin:  Negative for color change and rash.  Neurological:  Negative for dizziness, tremors,  light-headedness and headaches.  Hematological:  Negative for adenopathy. Does not bruise/bleed easily.  Psychiatric/Behavioral:  Negative for dysphoric mood and sleep disturbance. The patient is not nervous/anxious.     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 Shelda Jakes   BREAST BIOPSY Right 2006   bx/clip-neg   BREAST BIOPSY Right 08/19/2022   Right Breast stereo BX, coil clip, path pending   CATARACT EXTRACTION W/PHACO Right 03/16/2018   Procedure: CATARACT EXTRACTION PHACO AND INTRAOCULAR LENS PLACEMENT ([place]) RIGHT;  Surgeon: Leandrew Koyanagi, MD;  Location: [facility];  Service: Ophthalmology;  Laterality: Right;  requests early   Cologuard home test  12/2015   Negative   RETINAL DETACHMENT SURGERY Right 2018    Social History   Tobacco Use   Smoking status: Never   Smokeless tobacco: Never  Vaping Use   Vaping Use: Never used  Substance Use Topics   Alcohol use: No   Drug use: No     Medication list has been reviewed and updated.  Current Meds  Medication Sig   Ascorbic Acid (VITAMIN C) 100 MG tablet Take 1 tablet by mouth daily.   aspirin 81 MG tablet Take 1 tablet by mouth daily.   atorvastatin (LIPITOR) 40 MG tablet TAKE 1 TABLET BY MOUTH EVERY DAY   Calcium Carbonate (CALCIUM 600 PO) Take 1 tablet by mouth daily.   levothyroxine (SYNTHROID) 100 MCG tablet  TAKE 1 TABLET(100 MCG) BY MOUTH DAILY   LYSINE PO Take by mouth as needed.   MULTIPLE VITAMIN PO Take 1 tablet by mouth daily.   Vitamin D, Cholecalciferol, 1000 UNITS TABS Take 1 tablet by mouth daily.   Vitamin E 100 UNITS TABS Take 1 capsule by mouth daily.       11/06/2021    8:14 AM 10/31/2020    8:15 AM  GAD 7 : Generalized Anxiety Score  Nervous, Anxious, on Edge 1 0  Control/stop worrying 0 0  Worry too much - different things 0 0  Trouble relaxing 0 0  Restless 0 0  Easily annoyed or irritable 0 0  Afraid - awful might happen 0 0  Total GAD 7 Score 1 0  Anxiety Difficulty Not difficult at all        04/01/2022    9:23 AM 11/06/2021    8:14 AM 10/31/2020    8:15 AM  Depression screen PHQ 2/9  Decreased Interest 0 0 0  Down, Depressed, Hopeless 0 0 0  PHQ - 2 Score 0 0 0  Altered sleeping  1 0  Tired, decreased energy  0 0  Change in appetite  0 0  Feeling bad or failure  about yourself   0 0  Trouble concentrating  0 0  Moving slowly or fidgety/restless  0 0  Suicidal thoughts  0 0  PHQ-9 Score  1 0  Difficult doing work/chores  Not difficult at all     BP Readings from Last 3 Encounters:  11/10/22 134/74  11/06/21 128/74  10/31/20 134/72    Physical Exam Vitals and nursing note reviewed.  Constitutional:      General: She is not in acute distress.    Appearance: She is well-developed.  HENT:     Head: Normocephalic and atraumatic.     Right Ear: Tympanic membrane and ear canal normal.     Left Ear: Tympanic membrane and ear canal normal.     Nose:     Right Sinus: No maxillary sinus tenderness.     Left Sinus: No maxillary sinus tenderness.  Eyes:     General: No scleral icterus.       Right eye: No discharge.        Left eye: No discharge.     Conjunctiva/sclera: Conjunctivae normal.  Neck:     Thyroid: No thyromegaly.     Vascular: No carotid bruit.  Cardiovascular:     Rate and Rhythm: Normal rate and regular rhythm.     Pulses: Normal  pulses.     Heart sounds: Normal heart sounds.  Pulmonary:     Effort: Pulmonary effort is normal. No respiratory distress.     Breath sounds: No wheezing.  Chest:  Breasts:    Right: No mass, nipple discharge, skin change or tenderness.     Left: No mass, nipple discharge, skin change or tenderness.  Abdominal:     General: Bowel sounds are normal.     Palpations: Abdomen is soft.     Tenderness: There is no abdominal tenderness.  Musculoskeletal:     Cervical back: Normal range of motion. No erythema.     Right lower leg: No edema.     Left lower leg: No edema.  Lymphadenopathy:     Cervical: No cervical adenopathy.  Skin:    General: Skin is warm and dry.     Findings: No rash.  Neurological:     Mental Status: She is alert and oriented to person, place, and time.     Cranial Nerves: No cranial nerve deficit.     Sensory: No sensory deficit.     Deep Tendon Reflexes: Reflexes are normal and symmetric.  Psychiatric:        Attention and Perception: Attention normal.        Mood and Affect: Mood normal.    Diabetic Foot Exam - Simple   Simple Foot Form Diabetic Foot exam was performed with the following findings: Yes 11/10/2022  8:12 AM  Visual Inspection No deformities, no ulcerations, no other skin breakdown bilaterally: Yes Sensation Testing Intact to touch and monofilament testing bilaterally: Yes Pulse Check Posterior Tibialis and Dorsalis pulse intact bilaterally: Yes Comments      Wt Readings from Last 3 Encounters:  11/10/22 156 lb (70.8 kg)  11/06/21 157 lb 9.6 oz (71.5 kg)  10/31/20 155 lb (70.3 kg)    BP 134/74   Pulse 71   Ht _0  (1.676 m)   Wt 156 lb (70.8 kg)   SpO2 100%   BMI 25.18 kg/m   Assessment and Plan: 1. Annual physical exam Normal exam Continue healthy diet and exercise  2. Encounter for screening mammogram for breast cancer Schedule  bilateral in March after benign bx in September on Right - MM 3D SCREEN BREAST  BILATERAL  3. Acquired hypothyroidism supplemented - TSH + free T4  4. Mixed hyperlipidemia Tolerating statin medication without side effects at this time LDL is at goal of < 70 on current dose Continue same therapy without change at this time. - Lipid panel - atorvastatin (LIPITOR) 40 MG tablet; Take 1 tablet (40 mg total) by mouth daily.  Dispense: 90 tablet; Refill: 3  5. Prediabetes Continue diet and exercise, weight control - CBC with Differential/Platelet - Comprehensive metabolic panel - Hemoglobin A1c - Microalbumin / creatinine urine ratio - Urinalysis, Routine w reflex microscopic  6. Adult hypothyroidism supplemented - levothyroxine (SYNTHROID) 100 MCG tablet; Take 1 tablet (100 mcg total) by mouth daily before breakfast.  Dispense: 90 tablet; Refill: 3  7. Viral upper respiratory tract infection No s/s of bacterial infection. Continue otc cold medications, rest and fluids If symptoms of sinusitis occur, call for antibiotic Rx.   Partially dictated using Editor, commissioning. Any errors are unintentional.  Halina Maidens, MD [facility]  11/10/2022

## 2022-11-11 LAB — URINALYSIS, ROUTINE W REFLEX MICROSCOPIC
Bilirubin, UA: NEGATIVE
Glucose, UA: NEGATIVE
Nitrite, UA: NEGATIVE
RBC, UA: NEGATIVE
Specific Gravity, UA: 1.024 (ref 1.005–1.030)
Urobilinogen, Ur: 0.2 mg/dL (ref 0.2–1.0)
pH, UA: 5.5 (ref 5.0–7.5)

## 2022-11-11 LAB — CBC WITH DIFFERENTIAL/PLATELET
Basophils Absolute: 0.1 10*3/uL (ref 0.0–0.2)
Basos: 1 %
EOS (ABSOLUTE): 0.1 10*3/uL (ref 0.0–0.4)
Eos: 1 %
Hematocrit: 35.5 % (ref 34.0–46.6)
Hemoglobin: 11 g/dL — ABNORMAL LOW (ref 11.1–15.9)
Immature Grans (Abs): 0 10*3/uL (ref 0.0–0.1)
Immature Granulocytes: 0 %
Lymphocytes Absolute: 1.6 10*3/uL (ref 0.7–3.1)
Lymphs: 15 %
MCH: 26.5 pg — ABNORMAL LOW (ref 26.6–33.0)
MCHC: 31 g/dL — ABNORMAL LOW (ref 31.5–35.7)
MCV: 86 fL (ref 79–97)
Monocytes Absolute: 0.6 10*3/uL (ref 0.1–0.9)
Monocytes: 6 %
Neutrophils Absolute: 7.8 10*3/uL — ABNORMAL HIGH (ref 1.4–7.0)
Neutrophils: 77 %
Platelets: 287 10*3/uL (ref 150–450)
RBC: 4.15 x10E6/uL (ref 3.77–5.28)
RDW: 13.1 % (ref 11.7–15.4)
WBC: 10.2 10*3/uL (ref 3.4–10.8)

## 2022-11-11 LAB — MICROSCOPIC EXAMINATION
Bacteria, UA: NONE SEEN
Casts: NONE SEEN /lpf
WBC, UA: >30 /hpf — AB (ref 0–5)

## 2022-11-11 LAB — COMPREHENSIVE METABOLIC PANEL
ALT: 19 IU/L (ref 0–32)
AST: 22 IU/L (ref 0–40)
Albumin/Globulin Ratio: 2.3 — ABNORMAL HIGH (ref 1.2–2.2)
Albumin: 4.8 g/dL — ABNORMAL HIGH (ref 3.7–4.7)
Alkaline Phosphatase: 103 IU/L (ref 44–121)
BUN/Creatinine Ratio: 20 (ref 12–28)
BUN: 16 mg/dL (ref 8–27)
Bilirubin Total: 0.4 mg/dL (ref 0.0–1.2)
CO2: 21 mmol/L (ref 20–29)
Calcium: 9.7 mg/dL (ref 8.7–10.3)
Chloride: 101 mmol/L (ref 96–106)
Creatinine, Ser: 0.8 mg/dL (ref 0.57–1.00)
Globulin, Total: 2.1 g/dL (ref 1.5–4.5)
Glucose: 144 mg/dL — ABNORMAL HIGH (ref 70–99)
Potassium: 4.4 mmol/L (ref 3.5–5.2)
Sodium: 139 mmol/L (ref 134–144)
Total Protein: 6.9 g/dL (ref 6.0–8.5)
eGFR: 74 mL/min/{1.73_m2} (ref >59)

## 2022-11-11 LAB — MICROALBUMIN / CREATININE URINE RATIO
Creatinine, Urine: 203.5 mg/dL
Microalb/Creat Ratio: 29 mg/g creat (ref 0–29)
Microalbumin, Urine: 59.7 ug/mL

## 2022-11-11 LAB — TSH+FREE T4
Free T4: 1.31 ng/dL (ref 0.82–1.77)
TSH: 2.46 u[IU]/mL (ref 0.450–4.500)

## 2022-11-11 LAB — LIPID PANEL
Chol/HDL Ratio: 2.1 ratio (ref 0.0–4.4)
Cholesterol, Total: 196 mg/dL (ref 100–199)
HDL: 94 mg/dL (ref >39)
LDL Chol Calc (NIH): 78 mg/dL (ref 0–99)
Triglycerides: 146 mg/dL (ref 0–149)
VLDL Cholesterol Cal: 24 mg/dL (ref 5–40)

## 2022-11-11 LAB — HEMOGLOBIN A1C
Est. average glucose Bld gHb Est-mCnc: 143 mg/dL
Hgb A1c MFr Bld: 6.6 % — ABNORMAL HIGH (ref 4.8–5.6)

## 2022-12-03 LAB — IRON AND TIBC
Iron Saturation: 7 % — CL (ref 15–55)
Iron: 28 ug/dL (ref 27–139)
Total Iron Binding Capacity: 425 ug/dL (ref 250–450)
UIBC: 397 ug/dL — ABNORMAL HIGH (ref 118–369)

## 2022-12-03 LAB — FERRITIN: Ferritin: 14 ng/mL — ABNORMAL LOW (ref 15–150)

## 2022-12-03 LAB — SPECIMEN STATUS REPORT

## 2022-12-08 DIAGNOSIS — M9903 Segmental and somatic dysfunction of lumbar region: Secondary | ICD-10-CM | POA: Diagnosis not present

## 2022-12-08 DIAGNOSIS — M5442 Lumbago with sciatica, left side: Secondary | ICD-10-CM | POA: Diagnosis not present

## 2022-12-08 DIAGNOSIS — M9905 Segmental and somatic dysfunction of pelvic region: Secondary | ICD-10-CM | POA: Diagnosis not present

## 2022-12-08 DIAGNOSIS — M9902 Segmental and somatic dysfunction of thoracic region: Secondary | ICD-10-CM | POA: Diagnosis not present

## 2022-12-08 DIAGNOSIS — M546 Pain in thoracic spine: Secondary | ICD-10-CM | POA: Diagnosis not present

## 2022-12-08 DIAGNOSIS — M955 Acquired deformity of pelvis: Secondary | ICD-10-CM | POA: Diagnosis not present

## 2022-12-22 ENCOUNTER — Other Ambulatory Visit (INDEPENDENT_AMBULATORY_CARE_PROVIDER_SITE_OTHER): Payer: Medicare Other

## 2022-12-22 DIAGNOSIS — D508 Other iron deficiency anemias: Secondary | ICD-10-CM | POA: Diagnosis not present

## 2022-12-22 LAB — HEMOCCULT GUIAC POC 1CARD (OFFICE)
Card #2 Fecal Occult Blod, POC: NEGATIVE
Card #3 Fecal Occult Blood, POC: NEGATIVE
Fecal Occult Blood, POC: NEGATIVE

## 2022-12-22 NOTE — Progress Notes (Signed)
Negative hems entered

## 2022-12-23 ENCOUNTER — Other Ambulatory Visit: Payer: Self-pay

## 2022-12-23 DIAGNOSIS — D508 Other iron deficiency anemias: Secondary | ICD-10-CM

## 2022-12-29 DIAGNOSIS — M9902 Segmental and somatic dysfunction of thoracic region: Secondary | ICD-10-CM | POA: Diagnosis not present

## 2022-12-29 DIAGNOSIS — D508 Other iron deficiency anemias: Secondary | ICD-10-CM | POA: Diagnosis not present

## 2022-12-29 DIAGNOSIS — M955 Acquired deformity of pelvis: Secondary | ICD-10-CM | POA: Diagnosis not present

## 2022-12-29 DIAGNOSIS — M5442 Lumbago with sciatica, left side: Secondary | ICD-10-CM | POA: Diagnosis not present

## 2022-12-29 DIAGNOSIS — M9905 Segmental and somatic dysfunction of pelvic region: Secondary | ICD-10-CM | POA: Diagnosis not present

## 2022-12-29 DIAGNOSIS — M9903 Segmental and somatic dysfunction of lumbar region: Secondary | ICD-10-CM | POA: Diagnosis not present

## 2022-12-29 DIAGNOSIS — M546 Pain in thoracic spine: Secondary | ICD-10-CM | POA: Diagnosis not present

## 2022-12-30 LAB — IRON,TIBC AND FERRITIN PANEL
Ferritin: 36 ng/mL (ref 15–150)
Iron Saturation: 13 % — ABNORMAL LOW (ref 15–55)
Iron: 49 ug/dL (ref 27–139)
Total Iron Binding Capacity: 376 ug/dL (ref 250–450)
UIBC: 327 ug/dL (ref 118–369)

## 2022-12-30 LAB — CBC WITH DIFFERENTIAL/PLATELET
Basophils Absolute: 0.1 10*3/uL (ref 0.0–0.2)
Basos: 1 %
EOS (ABSOLUTE): 0.1 10*3/uL (ref 0.0–0.4)
Eos: 2 %
Hematocrit: 38.2 % (ref 34.0–46.6)
Hemoglobin: 11.8 g/dL (ref 11.1–15.9)
Immature Grans (Abs): 0 10*3/uL (ref 0.0–0.1)
Immature Granulocytes: 0 %
Lymphocytes Absolute: 1.4 10*3/uL (ref 0.7–3.1)
Lymphs: 25 %
MCH: 25.9 pg — ABNORMAL LOW (ref 26.6–33.0)
MCHC: 30.9 g/dL — ABNORMAL LOW (ref 31.5–35.7)
MCV: 84 fL (ref 79–97)
Monocytes Absolute: 0.6 10*3/uL (ref 0.1–0.9)
Monocytes: 10 %
Neutrophils Absolute: 3.5 10*3/uL (ref 1.4–7.0)
Neutrophils: 62 %
Platelets: 249 10*3/uL (ref 150–450)
RBC: 4.55 x10E6/uL (ref 3.77–5.28)
RDW: 15.9 % — ABNORMAL HIGH (ref 11.7–15.4)
WBC: 5.7 10*3/uL (ref 3.4–10.8)

## 2023-01-04 ENCOUNTER — Other Ambulatory Visit: Payer: Self-pay | Admitting: Internal Medicine

## 2023-01-04 DIAGNOSIS — R921 Mammographic calcification found on diagnostic imaging of breast: Secondary | ICD-10-CM

## 2023-01-04 DIAGNOSIS — R928 Other abnormal and inconclusive findings on diagnostic imaging of breast: Secondary | ICD-10-CM

## 2023-02-02 DIAGNOSIS — M955 Acquired deformity of pelvis: Secondary | ICD-10-CM | POA: Diagnosis not present

## 2023-02-02 DIAGNOSIS — M9903 Segmental and somatic dysfunction of lumbar region: Secondary | ICD-10-CM | POA: Diagnosis not present

## 2023-02-02 DIAGNOSIS — M546 Pain in thoracic spine: Secondary | ICD-10-CM | POA: Diagnosis not present

## 2023-02-02 DIAGNOSIS — M9902 Segmental and somatic dysfunction of thoracic region: Secondary | ICD-10-CM | POA: Diagnosis not present

## 2023-02-02 DIAGNOSIS — M5442 Lumbago with sciatica, left side: Secondary | ICD-10-CM | POA: Diagnosis not present

## 2023-02-02 DIAGNOSIS — M9905 Segmental and somatic dysfunction of pelvic region: Secondary | ICD-10-CM | POA: Diagnosis not present

## 2023-02-16 ENCOUNTER — Ambulatory Visit
Admission: RE | Admit: 2023-02-16 | Discharge: 2023-02-16 | Disposition: A | Discharge status: Home / Self Care | Payer: Medicare Other | Source: Ambulatory Visit | Attending: Internal Medicine | Admitting: Internal Medicine

## 2023-02-16 ENCOUNTER — Telehealth: Payer: Self-pay

## 2023-02-16 ENCOUNTER — Other Ambulatory Visit: Payer: Self-pay | Admitting: Internal Medicine

## 2023-02-16 DIAGNOSIS — R928 Other abnormal and inconclusive findings on diagnostic imaging of breast: Secondary | ICD-10-CM

## 2023-02-16 DIAGNOSIS — R921 Mammographic calcification found on diagnostic imaging of breast: Secondary | ICD-10-CM

## 2023-02-16 NOTE — Telephone Encounter (Signed)
[facility] called for a verbal order to complete patients yearly mammogram. Patient is there now, and they completed Diagnostic right but needed to do screening for left.  Verbal order given over the phone.  Alita Waldren

## 2023-02-23 DIAGNOSIS — M9902 Segmental and somatic dysfunction of thoracic region: Secondary | ICD-10-CM | POA: Diagnosis not present

## 2023-02-23 DIAGNOSIS — M9903 Segmental and somatic dysfunction of lumbar region: Secondary | ICD-10-CM | POA: Diagnosis not present

## 2023-02-23 DIAGNOSIS — M955 Acquired deformity of pelvis: Secondary | ICD-10-CM | POA: Diagnosis not present

## 2023-02-23 DIAGNOSIS — M5442 Lumbago with sciatica, left side: Secondary | ICD-10-CM | POA: Diagnosis not present

## 2023-02-23 DIAGNOSIS — M9905 Segmental and somatic dysfunction of pelvic region: Secondary | ICD-10-CM | POA: Diagnosis not present

## 2023-02-23 DIAGNOSIS — M546 Pain in thoracic spine: Secondary | ICD-10-CM | POA: Diagnosis not present

## 2023-03-16 DIAGNOSIS — M9905 Segmental and somatic dysfunction of pelvic region: Secondary | ICD-10-CM | POA: Diagnosis not present

## 2023-03-16 DIAGNOSIS — M9902 Segmental and somatic dysfunction of thoracic region: Secondary | ICD-10-CM | POA: Diagnosis not present

## 2023-03-16 DIAGNOSIS — M9903 Segmental and somatic dysfunction of lumbar region: Secondary | ICD-10-CM | POA: Diagnosis not present

## 2023-03-16 DIAGNOSIS — M5442 Lumbago with sciatica, left side: Secondary | ICD-10-CM | POA: Diagnosis not present

## 2023-03-16 DIAGNOSIS — M546 Pain in thoracic spine: Secondary | ICD-10-CM | POA: Diagnosis not present

## 2023-03-16 DIAGNOSIS — M955 Acquired deformity of pelvis: Secondary | ICD-10-CM | POA: Diagnosis not present

## 2023-03-18 ENCOUNTER — Telehealth: Payer: Self-pay | Admitting: Internal Medicine

## 2023-03-18 DIAGNOSIS — H2512 Age-related nuclear cataract, left eye: Secondary | ICD-10-CM | POA: Diagnosis not present

## 2023-03-18 DIAGNOSIS — H5213 Myopia, bilateral: Secondary | ICD-10-CM | POA: Diagnosis not present

## 2023-03-18 DIAGNOSIS — Z961 Presence of intraocular lens: Secondary | ICD-10-CM | POA: Diagnosis not present

## 2023-03-18 NOTE — Telephone Encounter (Signed)
Contacted Kelsey Pacheco to schedule their annual wellness visit. Appointment made for 04/14/2023.  Verlee Rossetti; Care Guide Ambulatory Clinical Support [place] [facility] Direct Dial: [phone]

## 2023-03-18 NOTE — Telephone Encounter (Signed)
ERROR

## 2023-03-18 NOTE — Telephone Encounter (Signed)
Copied from CRM [id number]. Topic: Medicare AWV >> Mar 18, 2023  9:45 AM Payton Doughty wrote: Reason for CRM: Called patient to reschedule Medicare Annual Wellness Visit (AWV). Left message for patient to call back and reschedule Medicare Annual Wellness Visit (AWV) cancelled 04/04/22.  Please confirm new AWV appt date 04/14/2023 :30am  Please schedule an appointment at any time with Kennedy Bucker, LPN  .  If any questions, please contact me.  Thank you ,  Verlee Rossetti; Care Guide Ambulatory Clinical Support [place] [facility] Direct Dial: [phone]

## 2023-04-14 ENCOUNTER — Ambulatory Visit (INDEPENDENT_AMBULATORY_CARE_PROVIDER_SITE_OTHER): Payer: Medicare Other

## 2023-04-14 VITALS — Ht 66.0 in | Wt 156.0 lb

## 2023-04-14 DIAGNOSIS — Z0000 Encounter for general adult medical examination without abnormal findings: Secondary | ICD-10-CM

## 2023-04-14 NOTE — Patient Instructions (Signed)
Ms. Kelsey Pacheco , Thank you for taking time to come for your Medicare Wellness Visit. I appreciate your ongoing commitment to your health goals. Please review the following plan we discussed and let me know if I can assist you in the future.   These are the goals we discussed:  Goals      DIET - INCREASE WATER INTAKE     Eat more fruits and vegetables     Recommend to increase fruit and vegetable servings to 5 per day     Increase physical activity     Recommend increasing physical activity to 150 minutes per week.        This is a list of the screening recommended for you and due dates:  Health Maintenance  Topic Date Due   Zoster (Shingles) Vaccine (1 of 2) Never done   DTaP/Tdap/Td vaccine (4 - Td or Tdap) 08/28/2021   COVID-19 Vaccine (5 - 2023-24 season) 11/14/2022   Flu Shot  06/24/2023   Medicare Annual Wellness Visit  04/13/2024   Pneumonia Vaccine  Completed   DEXA scan (bone density measurement)  Completed   HPV Vaccine  Aged Out    Advanced directives: yes  Conditions/risks identified: none  Next appointment: Follow up in one year for your annual wellness visit 04/19/24 @ 9:15 by phone   Preventive Care 65 Years and Older, Female Preventive care refers to lifestyle choices and visits with your health care provider that can promote health and wellness. What does preventive care include? A yearly physical exam. This is also called an annual well check. Dental exams once or twice a year. Routine eye exams. Ask your health care provider how often you should have your eyes checked. Personal lifestyle choices, including: Daily care of your teeth and gums. Regular physical activity. Eating a healthy diet. Avoiding tobacco and drug use. Limiting alcohol use. Practicing safe sex. Taking low-dose aspirin every day. Taking vitamin and mineral supplements as recommended by your health care provider. What happens during an annual well check? The services and screenings done  by your health care provider during your annual well check will depend on your age, overall health, lifestyle risk factors, and family history of disease. Counseling  Your health care provider may ask you questions about your: Alcohol use. Tobacco use. Drug use. Emotional well-being. Home and relationship well-being. Sexual activity. Eating habits. History of falls. Memory and ability to understand (cognition). Work and work Astronomer. Reproductive health. Screening  You may have the following tests or measurements: Height, weight, and BMI. Blood pressure. Lipid and cholesterol levels. These may be checked every 5 years, or more frequently if you are over 75 years old. Skin check. Lung cancer screening. You may have this screening every year starting at age 90 if you have a 30-pack-year history of smoking and currently smoke or have quit within the past 15 years. Fecal occult blood test (FOBT) of the stool. You may have this test every year starting at age 21. Flexible sigmoidoscopy or colonoscopy. You may have a sigmoidoscopy every 5 years or a colonoscopy every 10 years starting at age 87. Hepatitis C blood test. Hepatitis B blood test. Sexually transmitted disease (STD) testing. Diabetes screening. This is done by checking your blood sugar (glucose) after you have not eaten for a while (fasting). You may have this done every 1-3 years. Bone density scan. This is done to screen for osteoporosis. You may have this done starting at age 37. Mammogram. This may be done  every 1-2 years. Talk to your health care provider about how often you should have regular mammograms. Talk with your health care provider about your test results, treatment options, and if necessary, the need for more tests. Vaccines  Your health care provider may recommend certain vaccines, such as: Influenza vaccine. This is recommended every year. Tetanus, diphtheria, and acellular pertussis (Tdap, Td) vaccine. You  may need a Td booster every 10 years. Zoster vaccine. You may need this after age 22. Pneumococcal 13-valent conjugate (PCV13) vaccine. One dose is recommended after age 46. Pneumococcal polysaccharide (PPSV23) vaccine. One dose is recommended after age 43. Talk to your health care provider about which screenings and vaccines you need and how often you need them. This information is not intended to replace advice given to you by your health care provider. Make sure you discuss any questions you have with your health care provider. Document Released: 12/06/2015 Document Revised: 07/29/2016 Document Reviewed: 09/10/2015 Elsevier Interactive Patient Education  2017 [employer].  Fall Prevention in the Home Falls can cause injuries. They can happen to people of all ages. There are many things you can do to make your home safe and to help prevent falls. What can I do on the outside of my home? Regularly fix the edges of walkways and driveways and fix any cracks. Remove anything that might make you trip as you walk through a door, such as a raised step or threshold. Trim any bushes or trees on the path to your home. Use bright outdoor lighting. Clear any walking paths of anything that might make someone trip, such as rocks or tools. Regularly check to see if handrails are loose or broken. Make sure that both sides of any steps have handrails. Any raised decks and porches should have guardrails on the edges. Have any leaves, snow, or ice cleared regularly. Use sand or salt on walking paths during winter. Clean up any spills in your garage right away. This includes oil or grease spills. What can I do in the bathroom? Use night lights. Install grab bars by the toilet and in the tub and shower. Do not use towel bars as grab bars. Use non-skid mats or decals in the tub or shower. If you need to sit down in the shower, use a plastic, non-slip stool. Keep the floor dry. Clean up any water that spills  on the floor as soon as it happens. Remove soap buildup in the tub or shower regularly. Attach bath mats securely with double-sided non-slip rug tape. Do not have throw rugs and other things on the floor that can make you trip. What can I do in the bedroom? Use night lights. Make sure that you have a light by your bed that is easy to reach. Do not use any sheets or blankets that are too big for your bed. They should not hang down onto the floor. Have a firm chair that has side arms. You can use this for support while you get dressed. Do not have throw rugs and other things on the floor that can make you trip. What can I do in the kitchen? Clean up any spills right away. Avoid walking on wet floors. Keep items that you use a lot in easy-to-reach places. If you need to reach something above you, use a strong step stool that has a grab bar. Keep electrical cords out of the way. Do not use floor polish or wax that makes floors slippery. If you must use wax, use  non-skid floor wax. Do not have throw rugs and other things on the floor that can make you trip. What can I do with my stairs? Do not leave any items on the stairs. Make sure that there are handrails on both sides of the stairs and use them. Fix handrails that are broken or loose. Make sure that handrails are as long as the stairways. Check any carpeting to make sure that it is firmly attached to the stairs. Fix any carpet that is loose or worn. Avoid having throw rugs at the top or bottom of the stairs. If you do have throw rugs, attach them to the floor with carpet tape. Make sure that you have a light switch at the top of the stairs and the bottom of the stairs. If you do not have them, ask someone to add them for you. What else can I do to help prevent falls? Wear shoes that: Do not have high heels. Have rubber bottoms. Are comfortable and fit you well. Are closed at the toe. Do not wear sandals. If you use a stepladder: Make  sure that it is fully opened. Do not climb a closed stepladder. Make sure that both sides of the stepladder are locked into place. Ask someone to hold it for you, if possible. Clearly mark and make sure that you can see: Any grab bars or handrails. First and last steps. Where the edge of each step is. Use tools that help you move around (mobility aids) if they are needed. These include: Canes. Walkers. Scooters. Crutches. Turn on the lights when you go into a dark area. Replace any light bulbs as soon as they burn out. Set up your furniture so you have a clear path. Avoid moving your furniture around. If any of your floors are uneven, fix them. If there are any pets around you, be aware of where they are. Review your medicines with your doctor. Some medicines can make you feel dizzy. This can increase your chance of falling. Ask your doctor what other things that you can do to help prevent falls. This information is not intended to replace advice given to you by your health care provider. Make sure you discuss any questions you have with your health care provider. Document Released: 09/05/2009 Document Revised: 04/16/2016 Document Reviewed: 12/14/2014 Elsevier Interactive Patient Education  2017 [employer].

## 2023-04-14 NOTE — Progress Notes (Signed)
I connected with  [name] on 04/14/23 by a audio enabled telemedicine application and verified that I am speaking with the correct person using two identifiers.  Patient Location: Home  Provider Location: Office/Clinic  I discussed the limitations of evaluation and management by telemedicine. The patient expressed understanding and agreed to proceed.  Subjective:   Kelsey Pacheco is a 82 y.o. female who presents for Medicare Annual (Subsequent) preventive examination.  Review of Systems     Cardiac Risk Factors include: advanced age (>91men, >49 women);dyslipidemia     Objective:    There were no vitals filed for this visit. There is no height or weight on file to calculate BMI.     04/14/2023    9:39 AM 04/01/2022    9:24 AM 08/05/2020    1:28 PM 03/16/2018    7:24 AM 09/06/2017    8:33 AM 11/20/2015    9:17 AM  Advanced Directives  Does Patient Have a Medical Advance Directive? Yes Yes Yes Yes Yes Yes  Type of Estate agent of [place];Living will Healthcare Power of [place];Living will Healthcare Power of [place];Living will Healthcare Power of [place];Living will Healthcare Power of [place];Living will Living will;Healthcare Power of Attorney  Does patient want to make changes to medical advance directive? No - Patient declined       Copy of Healthcare Power of Attorney in Chart? Yes - validated most recent copy scanned in chart (See row information) Yes - validated most recent copy scanned in chart (See row information) Yes - validated most recent copy scanned in chart (See row information) Yes No - copy requested     Current Medications (verified) Outpatient Encounter Medications as of 04/14/2023  Medication Sig   Ascorbic Acid (VITAMIN C) 100 MG tablet Take 1 tablet by mouth daily.   atorvastatin (LIPITOR) 40 MG tablet Take 1 tablet (40 mg total) by mouth daily.   Calcium Carbonate (CALCIUM 600 PO) Take 1 tablet by mouth  daily.   ferrous sulfate 325 (65 FE) MG EC tablet Take 325 mg by mouth 3 (three) times daily with meals.   levothyroxine (SYNTHROID) 100 MCG tablet Take 1 tablet (100 mcg total) by mouth daily before breakfast.   LYSINE PO Take by mouth as needed.   MULTIPLE VITAMIN PO Take 1 tablet by mouth daily.   Vitamin D, Cholecalciferol, 1000 UNITS TABS Take 1 tablet by mouth daily.   Vitamin E 100 UNITS TABS Take 1 capsule by mouth daily.   aspirin 81 MG tablet Take 1 tablet by mouth daily. (Patient not taking: Reported on 04/14/2023)   No facility-administered encounter medications on file as of 04/14/2023.    Allergies (verified) Fosamax [alendronate]   History: Past Medical History:  Diagnosis Date   Arthritis    lower back   Hyperlipidemia    Hypothyroidism    Osteoporosis    Sciatica of left side    Thyroid disease    Vitamin D deficiency    Past Surgical History:  Procedure Laterality Date   BASAL CELL CARCINOMA EXCISION  06/04/2011   [facility] (Mohs procedure) left orbital Dr. Cheryll Dessert   BREAST BIOPSY Right 2006   bx/clip-neg   BREAST BIOPSY Right 08/19/2022   Right Breast stereo BX, coil clip, DENSE STROMAL FIBROSIS - NEGATIVE FOR ATYPIA   CATARACT EXTRACTION W/PHACO Right 03/16/2018   Procedure: CATARACT EXTRACTION PHACO AND INTRAOCULAR LENS PLACEMENT (IOC) RIGHT;  Surgeon: Lockie Mola, MD;  Location: [facility];  Service: Ophthalmology;  Laterality: Right;  requests early   Cologuard home test  12/2015   Negative   RETINAL DETACHMENT SURGERY Right 2018   Family History  Problem Relation Age of Onset   Dementia Father    Heart disease Father    Heart attack Mother    Breast cancer Neg Hx    Social History   Socioeconomic History   Marital status: Widowed    Spouse name: Not on file   Number of children: 1   Years of education: HS   Highest education level: Not on file  Occupational History    Comment: RETIRED  Tobacco Use   Smoking status: Never    Smokeless tobacco: Never  Vaping Use   Vaping Use: Never used  Substance and Sexual Activity   Alcohol use: No   Drug use: No   Sexual activity: Never  Other Topics Concern   Not on file  Social History Narrative   Pt lives alone   Social Determinants of Health   Financial Resource Strain: Low Risk  (04/14/2023)   Overall Financial Resource Strain (CARDIA)    Difficulty of Paying Living Expenses: Not hard at all  Food Insecurity: No Food Insecurity (04/14/2023)   Hunger Vital Sign    Worried About Running Out of Food in the Last Year: Never true    Ran Out of Food in the Last Year: Never true  Transportation Needs: No Transportation Needs (04/14/2023)   PRAPARE - Administrator, Civil Service (Medical): No    Lack of Transportation (Non-Medical): No  Physical Activity: Insufficiently Active (04/14/2023)   Exercise Vital Sign    Days of Exercise per Week: 3 days    Minutes of Exercise per Session: 30 min  Stress: No Stress Concern Present (04/14/2023)   [employer] of Occupational Health - Occupational Stress Questionnaire    Feeling of Stress : Not at all  Social Connections: Socially Isolated (04/14/2023)   Social Connection and Isolation Panel [NHANES]    Frequency of Communication with Friends and Family: More than three times a week    Frequency of Social Gatherings with Friends and Family: Twice a week    Attends Religious Services: Never    Database administrator or Organizations: No    Attends Banker Meetings: Never    Marital Status: Widowed    Tobacco Counseling Counseling given: Not Answered   Clinical Intake:  Pre-visit preparation completed: Yes  Pain : No/denies pain     Nutritional Risks: None Diabetes: No  How often do you need to have someone help you when you read instructions, pamphlets, or other written materials from your doctor or pharmacy?: 1 - Never  Diabetic?no  Interpreter Needed?: No  Information entered  by :: Kennedy Bucker, LPN   Activities of Daily Living    04/14/2023    9:41 AM  In your present state of health, do you have any difficulty performing the following activities:  Hearing? 0  Vision? 0  Difficulty concentrating or making decisions? 0  Walking or climbing stairs? 0  Dressing or bathing? 0  Doing errands, shopping? 0  Preparing Food and eating ? N  Using the Toilet? N  In the past six months, have you accidently leaked urine? N  Do you have problems with loss of bowel control? N  Managing your Medications? N  Managing your Finances? N  Housekeeping or managing your Housekeeping? N    Patient Care Team: Reubin Milan, MD  as PCP - General (Internal Medicine)  Indicate any recent Medical Services you may have received from other than [facility] providers in the past year (date may be approximate).     Assessment:   This is a routine wellness examination for [facility].  Hearing/Vision screen Hearing Screening - Comments:: No aids Vision Screening - Comments:: Contacts and glasses-  Eye   Dietary issues and exercise activities discussed: Current Exercise Habits: Home exercise routine, Type of exercise: walking, Time (Minutes): 30, Frequency (Times/Week): 3, Weekly Exercise (Minutes/Week): 90, Intensity: Mild   Goals Addressed             This Visit's Progress    DIET - INCREASE WATER INTAKE         Depression Screen    04/14/2023    9:37 AM 11/10/2022    8:00 AM 04/01/2022    9:23 AM 11/06/2021    8:14 AM 10/31/2020    8:15 AM 08/05/2020    1:26 PM 02/07/2020   10:25 AM  PHQ 2/9 Scores  PHQ - 2 Score 1 2 0 0 0 0 0  PHQ- 9 Score 1 6  1  0      Fall Risk    04/14/2023    9:41 AM 11/10/2022    8:00 AM 04/01/2022    9:24 AM 11/06/2021    8:15 AM 10/31/2020    8:15 AM  Fall Risk   Falls in the past year? 0 0 0 0 0  Number falls in past yr: 0 0 0 0   Injury with Fall? 0 0 0 0   Risk for fall due to : No Fall Risks No Fall Risks No Fall Risks  No Fall Risks   Follow up Falls prevention discussed;Falls evaluation completed Falls evaluation completed Falls prevention discussed Falls evaluation completed Falls evaluation completed    FALL RISK PREVENTION PERTAINING TO THE HOME:  Any stairs in or around the home? Yes  If so, are there any without handrails? No  Home free of loose throw rugs in walkways, pet beds, electrical cords, etc? Yes  Adequate lighting in your home to reduce risk of falls? Yes   ASSISTIVE DEVICES UTILIZED TO PREVENT FALLS:  Life alert? No  Use of a cane, walker or w/c? Yes - cane Grab bars in the bathroom? Yes  Shower chair or bench in shower? Yes  Elevated toilet seat or a handicapped toilet? Yes    Cognitive Function:        04/14/2023    9:44 AM 08/05/2020    1:30 PM 09/06/2017    8:34 AM 10/23/2016    8:27 AM  6CIT Screen  What Year? 0 points 0 points 0 points 0 points  What month? 0 points 0 points 0 points 0 points  What time? 0 points 0 points 0 points 0 points  Count back from 20 0 points 0 points 0 points 0 points  Months in reverse 0 points 0 points 0 points 0 points  Repeat phrase 0 points 0 points 4 points 0 points  Total Score 0 points 0 points 4 points 0 points    Immunizations Immunization History  Administered Date(s) Administered   Fluad Quad(high Dose 65+) 09/19/2022   Influenza, High Dose Seasonal PF 09/06/2017, 09/12/2018   Influenza,inj,Quad PF,6+ Mos 09/21/2016, 09/07/2019   Influenza-Unspecified 08/23/2013, 09/07/2019, 09/03/2020, 09/12/2021   Moderna SARS-COV2 Booster Vaccination 09/19/2022   Moderna Sars-Covid-2 Vaccination 01/06/2020, 02/04/2020, 09/26/2020   Pneumococcal Conjugate-13 04/23/2014   Pneumococcal Polysaccharide-23 09/14/2007  Td 09/14/2007, 08/29/2011   Tdap 08/29/2011   Zoster, Live 05/30/2011    TDAP status: Due, Education has been provided regarding the importance of this vaccine. Advised may receive this vaccine at local pharmacy or Health  Dept. Aware to provide a copy of the vaccination record if obtained from local pharmacy or Health Dept. Verbalized acceptance and understanding.  Flu Vaccine status: Up to date  Pneumococcal vaccine status: Up to date  Covid-19 vaccine status: Completed vaccines  Qualifies for Shingles Vaccine? Yes   Zostavax completed Yes   Shingrix Completed?: No.    Education has been provided regarding the importance of this vaccine. Patient has been advised to call insurance company to determine out of pocket expense if they have not yet received this vaccine. Advised may also receive vaccine at local pharmacy or Health Dept. Verbalized acceptance and understanding.  Screening Tests Health Maintenance  Topic Date Due   Zoster Vaccines- Shingrix (1 of 2) Never done   DTaP/Tdap/Td (4 - Td or Tdap) 08/28/2021   COVID-19 Vaccine (5 - 2023-24 season) 11/14/2022   INFLUENZA VACCINE  06/24/2023   Medicare Annual Wellness (AWV)  04/13/2024   Pneumonia Vaccine 39+ Years old  Completed   DEXA SCAN  Completed   HPV VACCINES  Aged Out    Health Maintenance  Health Maintenance Due  Topic Date Due   Zoster Vaccines- Shingrix (1 of 2) Never done   DTaP/Tdap/Td (4 - Td or Tdap) 08/28/2021   COVID-19 Vaccine (5 - 2023-24 season) 11/14/2022    Colorectal cancer screening: No longer required.   Mammogram status: No longer required due to age.- she had biopsy and a repeat mammogram in March 2024- has mammogram every year  Bone Density status: Completed 01/17/20. Results reflect: Bone density results: OSTEOPENIA. Repeat every 5 years.  Lung Cancer Screening: (Low Dose CT Chest recommended if Age 74-80 years, 30 pack-year currently smoking OR have quit w/in 15years.) does not qualify.   Additional Screening:  Hepatitis C Screening: does not qualify; Completed no  Vision Screening: Recommended annual ophthalmology exams for early detection of glaucoma and other disorders of the eye. Is the patient up to  date with their annual eye exam?  Yes  Who is the provider or what is the name of the office in which the patient attends annual eye exams? [facility] If pt is not established with a provider, would they like to be referred to a provider to establish care? No .   Dental Screening: Recommended annual dental exams for proper oral hygiene  Community Resource Referral / Chronic Care Management: CRR required this visit?  No   CCM required this visit?  No      Plan:     I have personally reviewed and noted the following in the patient's chart:   Medical and social history Use of alcohol, tobacco or illicit drugs  Current medications and supplements including opioid prescriptions. Patient is not currently taking opioid prescriptions. Functional ability and status Nutritional status Physical activity Advanced directives List of other physicians Hospitalizations, surgeries, and ER visits in previous 12 months Vitals Screenings to include cognitive, depression, and falls Referrals and appointments  In addition, I have reviewed and discussed with patient certain preventive protocols, quality metrics, and best practice recommendations. A written personalized care plan for preventive services as well as general preventive health recommendations were provided to patient.     Hal Hope, LPN   [id number]   Nurse Notes: none

## 2023-04-22 DIAGNOSIS — H2512 Age-related nuclear cataract, left eye: Secondary | ICD-10-CM | POA: Diagnosis not present

## 2023-04-22 DIAGNOSIS — H26491 Other secondary cataract, right eye: Secondary | ICD-10-CM | POA: Diagnosis not present

## 2023-04-22 DIAGNOSIS — H3321 Serous retinal detachment, right eye: Secondary | ICD-10-CM | POA: Diagnosis not present

## 2023-04-22 DIAGNOSIS — H35372 Puckering of macula, left eye: Secondary | ICD-10-CM | POA: Diagnosis not present

## 2023-04-27 DIAGNOSIS — M9905 Segmental and somatic dysfunction of pelvic region: Secondary | ICD-10-CM | POA: Diagnosis not present

## 2023-04-27 DIAGNOSIS — M955 Acquired deformity of pelvis: Secondary | ICD-10-CM | POA: Diagnosis not present

## 2023-04-27 DIAGNOSIS — M9902 Segmental and somatic dysfunction of thoracic region: Secondary | ICD-10-CM | POA: Diagnosis not present

## 2023-04-27 DIAGNOSIS — M5442 Lumbago with sciatica, left side: Secondary | ICD-10-CM | POA: Diagnosis not present

## 2023-04-27 DIAGNOSIS — M9903 Segmental and somatic dysfunction of lumbar region: Secondary | ICD-10-CM | POA: Diagnosis not present

## 2023-04-27 DIAGNOSIS — M546 Pain in thoracic spine: Secondary | ICD-10-CM | POA: Diagnosis not present

## 2023-05-05 DIAGNOSIS — H2512 Age-related nuclear cataract, left eye: Secondary | ICD-10-CM | POA: Diagnosis not present

## 2023-05-13 ENCOUNTER — Ambulatory Visit: Payer: Medicare Other | Admitting: Internal Medicine

## 2023-05-14 NOTE — Anesthesia Preprocedure Evaluation (Signed)
Anesthesia Evaluation  Patient identified by MRN, date of birth, ID band Patient awake    Reviewed: Allergy & Precautions, H&P , NPO status , Patient's Chart, lab work & pertinent test results  Airway Mallampati: I  TM Distance: <3 FB Neck ROM: Full   Comment: Very short TMD  Dental no notable dental hx.    Pulmonary neg pulmonary ROS   Pulmonary exam normal breath sounds clear to auscultation       Cardiovascular negative cardio ROS Normal cardiovascular exam Rhythm:Regular Rate:Normal     Neuro/Psych  Neuromuscular disease negative neurological ROS  negative psych ROS   GI/Hepatic negative GI ROS, Neg liver ROS,,,  Endo/Other  negative endocrine ROSHypothyroidism    Renal/GU negative Renal ROS  negative genitourinary   Musculoskeletal negative musculoskeletal ROS (+) Arthritis ,    Abdominal   Peds negative pediatric ROS (+)  Hematology negative hematology ROS (+)   Anesthesia Other Findings Hyperlipidemia  Thyroid disease Osteoporosis  Vitamin D deficiency Arthritis  Sciatica of left side Hypothyroidism     Reproductive/Obstetrics negative OB ROS                              Anesthesia Physical Anesthesia Plan  ASA: 2  Anesthesia Plan: MAC   Post-op Pain Management:    Induction: Intravenous  PONV Risk Score and Plan:   Airway Management Planned: Natural Airway and Nasal Cannula  Additional Equipment:   Intra-op Plan:   Post-operative Plan:   Informed Consent: I have reviewed the patients History and Physical, chart, labs and discussed the procedure including the risks, benefits and alternatives for the proposed anesthesia with the patient or authorized representative who has indicated his/her understanding and acceptance.     Dental Advisory Given  Plan Discussed with: Anesthesiologist, CRNA and Surgeon  Anesthesia Plan Comments: (Patient consented for  risks of anesthesia including but not limited to:  - adverse reactions to medications - damage to eyes, teeth, lips or other oral mucosa - nerve damage due to positioning  - sore throat or hoarseness - Damage to heart, brain, nerves, lungs, other parts of body or loss of life  Patient voiced understanding.)         Anesthesia Quick Evaluation

## 2023-05-17 MED ORDER — SODIUM CHLORIDE (PF) 0.9 % IJ SOLN
INTRAMUSCULAR | Status: AC
  Filled 2023-05-17: qty 10

## 2023-05-17 MED ORDER — PROPOFOL 10 MG/ML IV BOLUS
INTRAVENOUS | Status: AC
  Filled 2023-05-17: qty 80

## 2023-05-17 MED ORDER — PROPOFOL 1000 MG/100ML IV EMUL
INTRAVENOUS | Status: AC
  Filled 2023-05-17: qty 200

## 2023-05-17 NOTE — Discharge Instructions (Signed)

## 2023-05-19 ENCOUNTER — Ambulatory Visit: Payer: Medicare Other | Admitting: Anesthesiology

## 2023-05-19 ENCOUNTER — Encounter: Payer: Self-pay | Admitting: Ophthalmology

## 2023-05-19 ENCOUNTER — Other Ambulatory Visit: Payer: Self-pay

## 2023-05-19 ENCOUNTER — Ambulatory Visit
Admission: RE | Admit: 2023-05-19 | Discharge: 2023-05-19 | Disposition: A | Discharge status: Home / Self Care | Payer: Medicare Other | Attending: Ophthalmology | Admitting: Ophthalmology

## 2023-05-19 ENCOUNTER — Encounter
Admission: RE | Disposition: A | Discharge status: Home / Self Care | Payer: Self-pay | Source: Home / Self Care | Attending: Ophthalmology

## 2023-05-19 DIAGNOSIS — H2512 Age-related nuclear cataract, left eye: Secondary | ICD-10-CM | POA: Diagnosis not present

## 2023-05-19 DIAGNOSIS — M1990 Unspecified osteoarthritis, unspecified site: Secondary | ICD-10-CM | POA: Insufficient documentation

## 2023-05-19 DIAGNOSIS — G709 Myoneural disorder, unspecified: Secondary | ICD-10-CM | POA: Diagnosis not present

## 2023-05-19 DIAGNOSIS — I491 Atrial premature depolarization: Secondary | ICD-10-CM | POA: Diagnosis not present

## 2023-05-19 DIAGNOSIS — H269 Unspecified cataract: Secondary | ICD-10-CM | POA: Diagnosis not present

## 2023-05-19 HISTORY — PX: CATARACT EXTRACTION W/PHACO: SHX586

## 2023-05-19 SURGERY — PHACOEMULSIFICATION, CATARACT, WITH IOL INSERTION
Anesthesia: Monitor Anesthesia Care | Laterality: Left

## 2023-05-19 MED ORDER — SIGHTPATH DOSE#1 NA HYALUR & NA CHOND-NA HYALUR IO KIT
PACK | INTRAOCULAR | Status: DC | PRN
  Administered 2023-05-19: 1 via OPHTHALMIC

## 2023-05-19 MED ORDER — TETRACAINE HCL 0.5 % OP SOLN
1.0000 [drp] | OPHTHALMIC | Status: DC | PRN
  Administered 2023-05-19 (×3): 1 [drp] via OPHTHALMIC

## 2023-05-19 MED ORDER — MIDAZOLAM HCL 2 MG/2ML IJ SOLN
INTRAMUSCULAR | Status: DC | PRN
  Administered 2023-05-19: 1 mg via INTRAVENOUS

## 2023-05-19 MED ORDER — SIGHTPATH DOSE#1 BSS IO SOLN
INTRAOCULAR | Status: DC | PRN
  Administered 2023-05-19: 15 mL via INTRAOCULAR

## 2023-05-19 MED ORDER — FENTANYL CITRATE (PF) 100 MCG/2ML IJ SOLN
INTRAMUSCULAR | Status: DC | PRN
  Administered 2023-05-19: 50 ug via INTRAVENOUS

## 2023-05-19 MED ORDER — SIGHTPATH DOSE#1 BSS IO SOLN
INTRAOCULAR | Status: DC | PRN
  Administered 2023-05-19: 2 mL

## 2023-05-19 MED ORDER — ARMC OPHTHALMIC DILATING DROPS
1.0000 | OPHTHALMIC | Status: DC | PRN
  Administered 2023-05-19 (×3): 1 via OPHTHALMIC

## 2023-05-19 MED ORDER — CEFUROXIME OPHTHALMIC INJECTION 1 MG/0.1 ML
INJECTION | OPHTHALMIC | Status: DC | PRN
  Administered 2023-05-19: 1 mg via INTRACAMERAL

## 2023-05-19 MED ORDER — BRIMONIDINE TARTRATE-TIMOLOL 0.2-0.5 % OP SOLN
OPHTHALMIC | Status: DC | PRN
  Administered 2023-05-19: 1 [drp] via OPHTHALMIC

## 2023-05-19 MED ORDER — LACTATED RINGERS IV SOLN: INTRAVENOUS | Status: DC

## 2023-05-19 MED ORDER — SIGHTPATH DOSE#1 BSS IO SOLN
INTRAOCULAR | Status: DC | PRN
  Administered 2023-05-19: 75 mL via OPHTHALMIC

## 2023-05-19 SURGICAL SUPPLY — 9 items
CATARACT SUITE SIGHTPATH (MISCELLANEOUS) ×1 IMPLANT
FEE CATARACT SUITE SIGHTPATH (MISCELLANEOUS) ×1 IMPLANT
GLOVE SRG 8 PF TXTR STRL LF DI (GLOVE) ×1 IMPLANT
GLOVE SURG ENC TEXT LTX SZ7.5 (GLOVE) ×1 IMPLANT
GLOVE SURG UNDER POLY LF SZ8 (GLOVE) ×1
LENS IOL TECNIS EYHANCE 22.0 (Intraocular Lens) IMPLANT
NDL FILTER BLUNT 18X1 1/2 (NEEDLE) ×1 IMPLANT
NEEDLE FILTER BLUNT 18X1 1/2 (NEEDLE) ×1 IMPLANT
SYR 3ML LL SCALE MARK (SYRINGE) ×1 IMPLANT

## 2023-05-19 NOTE — H&P (Signed)
[facility]   Primary Care Physician:  Reubin Milan, MD Ophthalmologist: Dr. Lockie Mola  Pre-Procedure History & Physical: HPI:  Kelsey Pacheco is a 82 y.o. female here for ophthalmic surgery.   Past Medical History:  Diagnosis Date   Arthritis    lower back   Hyperlipidemia    Hypothyroidism    Osteoporosis    Sciatica of left side    Thyroid disease    Vitamin D deficiency     Past Surgical History:  Procedure Laterality Date   BASAL CELL CARCINOMA EXCISION  06/04/2011   [facility] (Mohs procedure) left orbital Dr. Cheryll Dessert   BREAST BIOPSY Right 2006   bx/clip-neg   BREAST BIOPSY Right 08/19/2022   Right Breast stereo BX, coil clip, DENSE STROMAL FIBROSIS - NEGATIVE FOR ATYPIA   CATARACT EXTRACTION W/PHACO Right 03/16/2018   Procedure: CATARACT EXTRACTION PHACO AND INTRAOCULAR LENS PLACEMENT (IOC) RIGHT;  Surgeon: Lockie Mola, MD;  Location: [facility];  Service: Ophthalmology;  Laterality: Right;  requests early   Cologuard home test  12/2015   Negative   RETINAL DETACHMENT SURGERY Right 2018    Prior to Admission medications   Medication Sig Start Date End Date Taking? Authorizing Provider  Ascorbic Acid (VITAMIN C) 100 MG tablet Take 1 tablet by mouth daily.   Yes [provider]  atorvastatin (LIPITOR) 40 MG tablet Take 1 tablet (40 mg total) by mouth daily. 11/10/22  Yes Reubin Milan, MD  Calcium Carbonate (CALCIUM 600 PO) Take 1 tablet by mouth daily.   Yes [provider]  ferrous sulfate 325 (65 FE) MG EC tablet Take 325 mg by mouth 3 (three) times daily with meals.   Yes [provider]  levothyroxine (SYNTHROID) 100 MCG tablet Take 1 tablet (100 mcg total) by mouth daily before breakfast. 11/10/22  Yes Reubin Milan, MD  LYSINE PO Take by mouth as needed.   Yes [provider]  MULTIPLE VITAMIN PO Take 1 tablet by mouth daily.   Yes [provider]  Vitamin D,  Cholecalciferol, 1000 UNITS TABS Take 1 tablet by mouth daily.   Yes [provider]  Vitamin E 100 UNITS TABS Take 1 capsule by mouth daily.   Yes [provider]  aspirin 81 MG tablet Take 1 tablet by mouth daily. Patient not taking: Reported on 04/14/2023    [provider]    Allergies as of 04/23/2023 - Review Complete 04/14/2023  Allergen Reaction Noted   Fosamax [alendronate] Other (See Comments) 10/23/2016    Family History  Problem Relation Age of Onset   Dementia Father    Heart disease Father    Heart attack Mother    Breast cancer Neg Hx     Social History   Socioeconomic History   Marital status: Widowed    Spouse name: Not on file   Number of children: 1   Years of education: HS   Highest education level: Not on file  Occupational History    Comment: RETIRED  Tobacco Use   Smoking status: Never   Smokeless tobacco: Never  Vaping Use   Vaping Use: Never used  Substance and Sexual Activity   Alcohol use: No   Drug use: No   Sexual activity: Never  Other Topics Concern   Not on file  Social History Narrative   Pt lives alone   Social Determinants of Health   Financial Resource Strain: Low Risk  (04/14/2023)   Overall Financial  Resource Strain (CARDIA)    Difficulty of Paying Living Expenses: Not hard at all  Food Insecurity: No Food Insecurity (04/14/2023)   Hunger Vital Sign    Worried About Running Out of Food in the Last Year: Never true    Ran Out of Food in the Last Year: Never true  Transportation Needs: No Transportation Needs (04/14/2023)   PRAPARE - Administrator, Civil Service (Medical): No    Lack of Transportation (Non-Medical): No  Physical Activity: Insufficiently Active (04/14/2023)   Exercise Vital Sign    Days of Exercise per Week: 3 days    Minutes of Exercise per Session: 30 min  Stress: No Stress Concern Present (04/14/2023)   [employer] of Occupational Health - Occupational Stress  Questionnaire    Feeling of Stress : Not at all  Social Connections: Socially Isolated (04/14/2023)   Social Connection and Isolation Panel [NHANES]    Frequency of Communication with Friends and Family: More than three times a week    Frequency of Social Gatherings with Friends and Family: Twice a week    Attends Religious Services: Never    Database administrator or Organizations: No    Attends Banker Meetings: Never    Marital Status: Widowed  Intimate Partner Violence: Not At Risk (04/14/2023)   Humiliation, Afraid, Rape, and Kick questionnaire    Fear of Current or Ex-Partner: No    Emotionally Abused: No    Physically Abused: No    Sexually Abused: No    Review of Systems: See HPI, otherwise negative ROS  Physical Exam: Ht 5\' 6"  (1.676 m)   Wt 67.1 kg   BMI 23.89 kg/m  General:   Alert,  pleasant and cooperative in NAD Head:  Normocephalic and atraumatic. Lungs:  Clear to auscultation.    Heart:  Regular rate and rhythm.   Impression/Plan: Kelsey Pacheco is here for ophthalmic surgery.  Risks, benefits, limitations, and alternatives regarding ophthalmic surgery have been reviewed with the patient.  Questions have been answered.  All parties agreeable.   Lockie Mola, MD  05/19/2023, 8:03 AM

## 2023-05-19 NOTE — Anesthesia Postprocedure Evaluation (Signed)
Anesthesia Post Note  Patient: Kelsey Pacheco  Procedure(s) Performed: CATARACT EXTRACTION PHACO AND INTRAOCULAR LENS PLACEMENT (IOC) LEFT 10.12 00:55.9 (Left)  Patient location during evaluation: PACU Anesthesia Type: MAC Level of consciousness: awake and alert Pain management: pain level controlled Vital Signs Assessment: post-procedure vital signs reviewed and stable Respiratory status: spontaneous breathing, nonlabored ventilation, respiratory function stable and patient connected to nasal cannula oxygen Cardiovascular status: stable and blood pressure returned to baseline Postop Assessment: no apparent nausea or vomiting Anesthetic complications: no   No notable events documented.   Last Vitals:  Vitals:   05/19/23 0912 05/19/23 0919  BP: (!) 128/57 128/79  Pulse: 65 69  Resp: 18   Temp: (!) 36.4 C 36.5 C  SpO2: 96% 100%    Last Pain:  Vitals:   05/19/23 0912  TempSrc:   PainSc: 0-No pain                 Zianne Schubring C Liberti [place]

## 2023-05-19 NOTE — Op Note (Signed)
OPERATIVE NOTE  Kelsey Pacheco [id number] 05/19/2023   PREOPERATIVE DIAGNOSIS:  Nuclear sclerotic cataract left eye. H25.12   POSTOPERATIVE DIAGNOSIS:    Nuclear sclerotic cataract left eye.     PROCEDURE:  Phacoemusification with posterior chamber intraocular lens placement of the left eye  Ultrasound time: Procedure(s): CATARACT EXTRACTION PHACO AND INTRAOCULAR LENS PLACEMENT (IOC) LEFT 10.12 00:55.9 (Left)  LENS:   Implant Name Type Inv. Item Serial No. Manufacturer Lot No. LRB No. Used Action  LENS IOL TECNIS EYHANCE 22.0 - [id number] Intraocular Lens LENS IOL TECNIS EYHANCE 22.0 [id number] [employer]  Left 1 Implanted      SURGEON:  Deirdre Evener, MD   ANESTHESIA:  Topical with tetracaine drops and 2% Xylocaine jelly, augmented with 1% preservative-free intracameral lidocaine.    COMPLICATIONS:  None.   DESCRIPTION OF PROCEDURE:  The patient was identified in the holding room and transported to the operating room and placed in the supine position under the operating microscope.  The left eye was identified as the operative eye and it was prepped and draped in the usual sterile ophthalmic fashion.   A 1 millimeter clear-corneal paracentesis was made at the 1:30 position.  0.5 ml of preservative-free 1% lidocaine was injected into the anterior chamber.  The anterior chamber was filled with Viscoat viscoelastic.  A 2.4 millimeter keratome was used to make a near-clear corneal incision at the 10:30 position.  .  A curvilinear capsulorrhexis was made with a cystotome and capsulorrhexis forceps.  Balanced salt solution was used to hydrodissect and hydrodelineate the nucleus.   Phacoemulsification was then used in stop and chop fashion to remove the lens nucleus and epinucleus.  The remaining cortex was then removed using the irrigation and aspiration handpiece. Provisc was then placed into the capsular bag to distend it for lens placement.  A lens was then injected into  the capsular bag.  The remaining viscoelastic was aspirated.   Wounds were hydrated with balanced salt solution.  The anterior chamber was inflated to a physiologic pressure with balanced salt solution.  No wound leaks were noted. Cefuroxime 0.1 ml of a 10mg /ml solution was injected into the anterior chamber for a dose of 1 mg of intracameral antibiotic at the completion of the case.   Timolol and Brimonidine drops were applied to the eye.  The patient was taken to the recovery room in stable condition without complications of anesthesia or surgery.  [name] 05/19/2023, 9:11 AM

## 2023-05-19 NOTE — Transfer of Care (Signed)
Immediate Anesthesia Transfer of Care Note  Patient: Kelsey Pacheco  Procedure(s) Performed: CATARACT EXTRACTION PHACO AND INTRAOCULAR LENS PLACEMENT (IOC) LEFT 10.12 00:55.9 (Left)  Patient Location: PACU  Anesthesia Type: MAC  Level of Consciousness: awake, alert  and patient cooperative  Airway and Oxygen Therapy: Patient Spontanous Breathing and Patient connected to supplemental oxygen  Post-op Assessment: Post-op Vital signs reviewed, Patient's Cardiovascular Status Stable, Respiratory Function Stable, Patent Airway and No signs of Nausea or vomiting  Post-op Vital Signs: Reviewed and stable  Complications: No notable events documented.

## 2023-05-20 ENCOUNTER — Encounter: Payer: Self-pay | Admitting: Ophthalmology

## 2023-05-25 DIAGNOSIS — H26491 Other secondary cataract, right eye: Secondary | ICD-10-CM | POA: Diagnosis not present

## 2023-06-15 DIAGNOSIS — M9903 Segmental and somatic dysfunction of lumbar region: Secondary | ICD-10-CM | POA: Diagnosis not present

## 2023-06-15 DIAGNOSIS — M955 Acquired deformity of pelvis: Secondary | ICD-10-CM | POA: Diagnosis not present

## 2023-06-15 DIAGNOSIS — M9905 Segmental and somatic dysfunction of pelvic region: Secondary | ICD-10-CM | POA: Diagnosis not present

## 2023-06-15 DIAGNOSIS — M9902 Segmental and somatic dysfunction of thoracic region: Secondary | ICD-10-CM | POA: Diagnosis not present

## 2023-06-15 DIAGNOSIS — M5442 Lumbago with sciatica, left side: Secondary | ICD-10-CM | POA: Diagnosis not present

## 2023-06-15 DIAGNOSIS — M546 Pain in thoracic spine: Secondary | ICD-10-CM | POA: Diagnosis not present

## 2023-06-29 DIAGNOSIS — M9905 Segmental and somatic dysfunction of pelvic region: Secondary | ICD-10-CM | POA: Diagnosis not present

## 2023-06-29 DIAGNOSIS — M955 Acquired deformity of pelvis: Secondary | ICD-10-CM | POA: Diagnosis not present

## 2023-06-29 DIAGNOSIS — M9903 Segmental and somatic dysfunction of lumbar region: Secondary | ICD-10-CM | POA: Diagnosis not present

## 2023-06-29 DIAGNOSIS — M5442 Lumbago with sciatica, left side: Secondary | ICD-10-CM | POA: Diagnosis not present

## 2023-06-29 DIAGNOSIS — M546 Pain in thoracic spine: Secondary | ICD-10-CM | POA: Diagnosis not present

## 2023-06-29 DIAGNOSIS — M9902 Segmental and somatic dysfunction of thoracic region: Secondary | ICD-10-CM | POA: Diagnosis not present

## 2023-07-20 DIAGNOSIS — M546 Pain in thoracic spine: Secondary | ICD-10-CM | POA: Diagnosis not present

## 2023-07-20 DIAGNOSIS — M9903 Segmental and somatic dysfunction of lumbar region: Secondary | ICD-10-CM | POA: Diagnosis not present

## 2023-07-20 DIAGNOSIS — M9902 Segmental and somatic dysfunction of thoracic region: Secondary | ICD-10-CM | POA: Diagnosis not present

## 2023-07-20 DIAGNOSIS — M5442 Lumbago with sciatica, left side: Secondary | ICD-10-CM | POA: Diagnosis not present

## 2023-07-20 DIAGNOSIS — M955 Acquired deformity of pelvis: Secondary | ICD-10-CM | POA: Diagnosis not present

## 2023-07-20 DIAGNOSIS — M9905 Segmental and somatic dysfunction of pelvic region: Secondary | ICD-10-CM | POA: Diagnosis not present

## 2023-08-10 DIAGNOSIS — M9903 Segmental and somatic dysfunction of lumbar region: Secondary | ICD-10-CM | POA: Diagnosis not present

## 2023-08-10 DIAGNOSIS — M9905 Segmental and somatic dysfunction of pelvic region: Secondary | ICD-10-CM | POA: Diagnosis not present

## 2023-08-10 DIAGNOSIS — M5442 Lumbago with sciatica, left side: Secondary | ICD-10-CM | POA: Diagnosis not present

## 2023-08-10 DIAGNOSIS — M9902 Segmental and somatic dysfunction of thoracic region: Secondary | ICD-10-CM | POA: Diagnosis not present

## 2023-08-10 DIAGNOSIS — M955 Acquired deformity of pelvis: Secondary | ICD-10-CM | POA: Diagnosis not present

## 2023-08-10 DIAGNOSIS — M546 Pain in thoracic spine: Secondary | ICD-10-CM | POA: Diagnosis not present

## 2023-08-24 DIAGNOSIS — M955 Acquired deformity of pelvis: Secondary | ICD-10-CM | POA: Diagnosis not present

## 2023-08-24 DIAGNOSIS — M9905 Segmental and somatic dysfunction of pelvic region: Secondary | ICD-10-CM | POA: Diagnosis not present

## 2023-08-24 DIAGNOSIS — M546 Pain in thoracic spine: Secondary | ICD-10-CM | POA: Diagnosis not present

## 2023-08-24 DIAGNOSIS — M9902 Segmental and somatic dysfunction of thoracic region: Secondary | ICD-10-CM | POA: Diagnosis not present

## 2023-08-24 DIAGNOSIS — M9903 Segmental and somatic dysfunction of lumbar region: Secondary | ICD-10-CM | POA: Diagnosis not present

## 2023-08-24 DIAGNOSIS — M5442 Lumbago with sciatica, left side: Secondary | ICD-10-CM | POA: Diagnosis not present

## 2023-09-14 DIAGNOSIS — M955 Acquired deformity of pelvis: Secondary | ICD-10-CM | POA: Diagnosis not present

## 2023-09-14 DIAGNOSIS — M546 Pain in thoracic spine: Secondary | ICD-10-CM | POA: Diagnosis not present

## 2023-09-14 DIAGNOSIS — M9903 Segmental and somatic dysfunction of lumbar region: Secondary | ICD-10-CM | POA: Diagnosis not present

## 2023-09-14 DIAGNOSIS — M9905 Segmental and somatic dysfunction of pelvic region: Secondary | ICD-10-CM | POA: Diagnosis not present

## 2023-09-14 DIAGNOSIS — M5442 Lumbago with sciatica, left side: Secondary | ICD-10-CM | POA: Diagnosis not present

## 2023-09-14 DIAGNOSIS — M9902 Segmental and somatic dysfunction of thoracic region: Secondary | ICD-10-CM | POA: Diagnosis not present

## 2023-11-15 ENCOUNTER — Encounter: Payer: Self-pay | Admitting: Internal Medicine

## 2023-11-15 ENCOUNTER — Ambulatory Visit (INDEPENDENT_AMBULATORY_CARE_PROVIDER_SITE_OTHER): Payer: Medicare Other | Admitting: Internal Medicine

## 2023-11-15 VITALS — BP 122/77 | HR 78 | Ht 65.0 in | Wt 148.4 lb

## 2023-11-15 DIAGNOSIS — E039 Hypothyroidism, unspecified: Secondary | ICD-10-CM

## 2023-11-15 DIAGNOSIS — E782 Mixed hyperlipidemia: Secondary | ICD-10-CM

## 2023-11-15 DIAGNOSIS — M8589 Other specified disorders of bone density and structure, multiple sites: Secondary | ICD-10-CM

## 2023-11-15 DIAGNOSIS — Z1211 Encounter for screening for malignant neoplasm of colon: Secondary | ICD-10-CM

## 2023-11-15 DIAGNOSIS — Z0000 Encounter for general adult medical examination without abnormal findings: Secondary | ICD-10-CM

## 2023-11-15 DIAGNOSIS — Z1231 Encounter for screening mammogram for malignant neoplasm of breast: Secondary | ICD-10-CM | POA: Diagnosis not present

## 2023-11-15 DIAGNOSIS — R7303 Prediabetes: Secondary | ICD-10-CM | POA: Diagnosis not present

## 2023-11-15 MED ORDER — LEVOTHYROXINE SODIUM 100 MCG PO TABS
100.0000 ug | ORAL_TABLET | Freq: Every day | ORAL | 3 refills | Status: AC
Start: 2023-11-15 — End: ?

## 2023-11-15 MED ORDER — ATORVASTATIN CALCIUM 40 MG PO TABS
40.0000 mg | ORAL_TABLET | Freq: Every day | ORAL | 3 refills | Status: AC
Start: 2023-11-15 — End: ?

## 2023-11-15 NOTE — Patient Instructions (Addendum)
Call [facility] to schedule your mammogram at [phone].  Osteo-Bioflex (Boswellia?)

## 2023-11-15 NOTE — Assessment & Plan Note (Signed)
LDL is  Lab Results  Component Value Date   LDLCALC 78 11/10/2022    Current regimen is atorvastatin.  Tolerating medications well without issues.

## 2023-11-15 NOTE — Assessment & Plan Note (Signed)
Continue vitamin D and calcium, weight bearing exercise

## 2023-11-15 NOTE — Progress Notes (Signed)
Date:  11/15/2023   Name:  Kelsey Pacheco   DOB:  [birth year]   MRN:  [id number]   Chief Complaint: Annual Exam Kelsey Pacheco is a 82 y.o. female who presents today for her Complete Annual Exam. She feels well. She reports exercising walking the dog. She reports she is sleeping fairly well.   Mammogram: 01/2023 DEXA: 12/2019 osteopenia Colonoscopy: 2007  Hemoccult neg 11/2022/ Cologuard 11/2021 neg  Immunization History  Administered Date(s) Administered   Fluad Quad(high Dose 65+) 09/19/2022   Influenza, High Dose Seasonal PF 09/06/2017, 09/12/2018   Influenza,inj,Quad PF,6+ Mos 09/21/2016, 09/07/2019   Influenza-Unspecified 08/23/2013, 09/03/2020, 09/12/2021, 09/07/2023   Moderna SARS-COV2 Booster Vaccination 09/19/2022   Moderna Sars-Covid-2 Vaccination 01/06/2020, 02/04/2020, 09/26/2020   Pneumococcal Conjugate-13 04/23/2014   Pneumococcal Polysaccharide-23 09/14/2007   Td 09/14/2007, 08/29/2011   Tdap 08/29/2011   Zoster, Live 05/30/2011   Health Maintenance Due  Topic Date Due   Zoster Vaccines- Shingrix (1 of 2) 02/26/1991   DTaP/Tdap/Td (4 - Td or Tdap) 08/28/2021   COVID-19 Vaccine (5 - 2024-25 season) 07/25/2023    No results found for: "PSA1", "PSA"   Hyperlipidemia This is a chronic problem. The problem is uncontrolled. Pertinent negatives include no chest pain, focal weakness or shortness of breath. Current antihyperlipidemic treatment includes statins.  Thyroid Problem Presents for follow-up visit. The symptoms have been stable. Her past medical history is significant for hyperlipidemia.  Diabetes She presents for her follow-up diabetic visit. Diabetes type: prediabaetes. Pertinent negatives for diabetes include no chest pain. Symptoms are stable.    Review of Systems  Respiratory:  Negative for shortness of breath.   Cardiovascular:  Negative for chest pain.  Neurological:  Negative for focal weakness.     Lab Results  Component Value Date   NA 139  11/10/2022   K 4.4 11/10/2022   CO2 21 11/10/2022   GLUCOSE 144 (H) 11/10/2022   BUN 16 11/10/2022   CREATININE 0.80 11/10/2022   CALCIUM 9.7 11/10/2022   EGFR 74 11/10/2022   GFRNONAA 59 (L) 10/31/2020   Lab Results  Component Value Date   CHOL 196 11/10/2022   HDL 94 11/10/2022   LDLCALC 78 11/10/2022   TRIG 146 11/10/2022   CHOLHDL 2.1 11/10/2022   Lab Results  Component Value Date   TSH 2.460 11/10/2022   Lab Results  Component Value Date   HGBA1C 6.6 (H) 11/10/2022   Lab Results  Component Value Date   WBC 5.7 12/29/2022   HGB 11.8 12/29/2022   HCT 38.2 12/29/2022   MCV 84 12/29/2022   PLT 249 12/29/2022   Lab Results  Component Value Date   ALT 19 11/10/2022   AST 22 11/10/2022   ALKPHOS 103 11/10/2022   BILITOT 0.4 11/10/2022   Lab Results  Component Value Date   VD25OH 29.0 (L) 11/06/2021     Patient Active Problem List   Diagnosis Date Noted   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 Cheryll Dessert   BREAST BIOPSY Right 2006   bx/clip-neg   BREAST BIOPSY Right 08/19/2022   Right Breast stereo BX, coil clip, DENSE STROMAL FIBROSIS - NEGATIVE FOR ATYPIA   CATARACT EXTRACTION W/PHACO Right 03/16/2018   Procedure: CATARACT EXTRACTION PHACO AND INTRAOCULAR LENS PLACEMENT (IOC) RIGHT;  Surgeon: Lockie Mola, MD;  Location: [facility];  Service: Ophthalmology;  Laterality: Right;  requests early    CATARACT EXTRACTION W/PHACO Left 05/19/2023   Procedure: CATARACT EXTRACTION PHACO AND INTRAOCULAR LENS PLACEMENT (IOC) LEFT 10.12 00:55.9;  Surgeon: Lockie Mola, MD;  Location: [facility];  Service: Ophthalmology;  Laterality: Left;   Cologuard home test  12/2015   Negative   RETINAL DETACHMENT SURGERY Right 2018    Social History   Tobacco Use   Smoking status: Never   Smokeless tobacco: Never  Vaping Use   Vaping status: Never Used  Substance Use Topics   Alcohol use: No   Drug use: No     Medication list has been reviewed and updated.  Current Meds  Medication Sig   Ascorbic Acid (VITAMIN C) 100 MG tablet Take 1 tablet by mouth daily.   Calcium Carbonate (CALCIUM 600 PO) Take 1 tablet by mouth daily.   ferrous sulfate 325 (65 FE) MG EC tablet Take 325 mg by mouth 3 (three) times daily with meals.   LYSINE PO Take by mouth as needed.   MULTIPLE VITAMIN PO Take 1 tablet by mouth daily.   Vitamin D, Cholecalciferol, 1000 UNITS TABS Take 1 tablet by mouth daily.   Vitamin E 100 UNITS TABS Take 1 capsule by mouth daily.   [DISCONTINUED] atorvastatin (LIPITOR) 40 MG tablet Take 1 tablet (40 mg total) by mouth daily.   [DISCONTINUED] levothyroxine (SYNTHROID) 100 MCG tablet Take 1 tablet (100 mcg total) by mouth daily before breakfast.       11/15/2023    7:58 AM 11/10/2022    8:00 AM 11/06/2021    8:14 AM 10/31/2020    8:15 AM  GAD 7 : Generalized Anxiety Score  Nervous, Anxious, on Edge 0 1 1 0  Control/stop worrying 1 0 0 0  Worry too much - different things 0 1 0 0  Trouble relaxing 0 1 0 0  Restless 0 1 0 0  Easily annoyed or irritable 0 1 0 0  Afraid - awful might happen 0 0 0 0  Total GAD 7 Score 1 5 1  0  Anxiety Difficulty Not difficult at all Not difficult at all Not difficult at all        11/15/2023    7:58 AM 04/14/2023    9:37 AM 11/10/2022    8:00 AM  Depression screen PHQ 2/9  Decreased Interest 1 0 1  Down, Depressed, Hopeless 1  1 1   PHQ - 2 Score 2 1 2   Altered sleeping 1 0 1  Tired, decreased energy 0 0 1  Change in appetite 0 0 0  Feeling bad or failure about yourself  0 0 1  Trouble concentrating 0 0 0  Moving slowly or fidgety/restless 0 0 1  Suicidal thoughts 0 0 0  PHQ-9 Score 3 1 6   Difficult doing work/chores Not difficult at all Not difficult at all Not difficult at all    BP Readings from Last 3 Encounters:  11/15/23 122/77  05/19/23 128/79  11/10/22 134/74    Physical Exam Vitals and nursing note reviewed.  Constitutional:  General: She is not in acute distress.    Appearance: She is well-developed.  HENT:     Head: Normocephalic and atraumatic.     Right Ear: Tympanic membrane and ear canal normal.     Left Ear: Tympanic membrane and ear canal normal.     Nose:     Right Sinus: No maxillary sinus tenderness.     Left Sinus: No maxillary sinus tenderness.  Eyes:     General: No scleral icterus.       Right eye: No discharge.        Left eye: No discharge.     Conjunctiva/sclera: Conjunctivae normal.  Neck:     Thyroid: No thyromegaly.     Vascular: No carotid bruit.  Cardiovascular:     Rate and Rhythm: Normal rate and regular rhythm.     Pulses: Normal pulses.     Heart sounds: Normal heart sounds.  Pulmonary:     Effort: Pulmonary effort is normal. No respiratory distress.     Breath sounds: No wheezing.  Abdominal:     General: Bowel sounds are normal.     Palpations: Abdomen is soft.     Tenderness: There is no abdominal tenderness.  Musculoskeletal:     Cervical back: Normal range of motion. No erythema.     Left hip: Decreased range of motion.     Right lower leg: No edema.     Left lower leg: No edema.  Lymphadenopathy:     Cervical: No cervical adenopathy.  Skin:    General: Skin is warm and dry.     Capillary Refill: Capillary refill takes less than 2 seconds.     Findings: No rash.  Neurological:     Mental Status: She is alert and oriented to person,  place, and time.     Cranial Nerves: No cranial nerve deficit.     Sensory: No sensory deficit.     Deep Tendon Reflexes: Reflexes are normal and symmetric.  Psychiatric:        Attention and Perception: Attention normal.        Mood and Affect: Mood normal.        Behavior: Behavior normal.     Wt Readings from Last 3 Encounters:  11/15/23 148 lb 6.4 oz (67.3 kg)  05/19/23 150 lb 12.8 oz (68.4 kg)  04/14/23 156 lb (70.8 kg)    BP 122/77   Pulse 78   Ht 5\' 5"  (1.651 m)   Wt 148 lb 6.4 oz (67.3 kg)   SpO2 100%   BMI 24.70 kg/m   Assessment and Plan:  Problem List Items Addressed This Visit       Unprioritized   Mixed hyperlipidemia (Chronic)   LDL is  Lab Results  Component Value Date   LDLCALC 78 11/10/2022    Current regimen is atorvastatin.  Tolerating medications well without issues.       Relevant Medications   atorvastatin (LIPITOR) 40 MG tablet   Other Relevant Orders   Comprehensive metabolic panel   Lipid panel   Acquired hypothyroidism (Chronic)   Supplemented. Lab Results  Component Value Date   TSH 2.460 11/10/2022         Relevant Medications   levothyroxine (SYNTHROID) 100 MCG tablet   Other Relevant Orders   TSH + free T4   Prediabetes (Chronic)   Managed with diet and weight control. Lab Results  Component Value Date   HGBA1C 6.6 (H) 11/10/2022  Relevant Orders   CBC with Differential/Platelet   Comprehensive metabolic panel   Hemoglobin A1c   Urinalysis, Routine w reflex microscopic   Osteopenia   Continue vitamin D and calcium, weight bearing exercise      Other Visit Diagnoses       Annual physical exam    -  Primary     Encounter for screening mammogram for breast cancer       schedule at [facility]   Relevant Orders   MM 3D SCREENING MAMMOGRAM BILATERAL BREAST     Colon cancer screening       up to date       No follow-ups on file.    Reubin Milan, MD [facility]  [place]

## 2023-11-15 NOTE — Assessment & Plan Note (Signed)
Managed with diet and weight control. Lab Results  Component Value Date   HGBA1C 6.6 (H) 11/10/2022

## 2023-11-15 NOTE — Assessment & Plan Note (Signed)
Supplemented. Lab Results  Component Value Date   TSH 2.460 11/10/2022

## 2023-11-16 LAB — CBC WITH DIFFERENTIAL/PLATELET
Basophils Absolute: 0.1 10*3/uL (ref 0.0–0.2)
Basos: 1 %
EOS (ABSOLUTE): 0.1 10*3/uL (ref 0.0–0.4)
Eos: 1 %
Hematocrit: 41.7 % (ref 34.0–46.6)
Hemoglobin: 13.5 g/dL (ref 11.1–15.9)
Immature Grans (Abs): 0 10*3/uL (ref 0.0–0.1)
Immature Granulocytes: 0 %
Lymphocytes Absolute: 1.3 10*3/uL (ref 0.7–3.1)
Lymphs: 17 %
MCH: 30.3 pg (ref 26.6–33.0)
MCHC: 32.4 g/dL (ref 31.5–35.7)
MCV: 94 fL (ref 79–97)
Monocytes Absolute: 0.5 10*3/uL (ref 0.1–0.9)
Monocytes: 6 %
Neutrophils Absolute: 5.8 10*3/uL (ref 1.4–7.0)
Neutrophils: 75 %
Platelets: 228 10*3/uL (ref 150–450)
RBC: 4.45 x10E6/uL (ref 3.77–5.28)
RDW: 12 % (ref 11.7–15.4)
WBC: 7.8 10*3/uL (ref 3.4–10.8)

## 2023-11-16 LAB — COMPREHENSIVE METABOLIC PANEL
ALT: 30 [IU]/L (ref 0–32)
AST: 27 [IU]/L (ref 0–40)
Albumin: 4.6 g/dL (ref 3.7–4.7)
Alkaline Phosphatase: 107 [IU]/L (ref 44–121)
BUN/Creatinine Ratio: 20 (ref 12–28)
BUN: 17 mg/dL (ref 8–27)
Bilirubin Total: 0.6 mg/dL (ref 0.0–1.2)
CO2: 25 mmol/L (ref 20–29)
Calcium: 9.9 mg/dL (ref 8.7–10.3)
Chloride: 102 mmol/L (ref 96–106)
Creatinine, Ser: 0.83 mg/dL (ref 0.57–1.00)
Globulin, Total: 2.2 g/dL (ref 1.5–4.5)
Glucose: 136 mg/dL — ABNORMAL HIGH (ref 70–99)
Potassium: 4.2 mmol/L (ref 3.5–5.2)
Sodium: 143 mmol/L (ref 134–144)
Total Protein: 6.8 g/dL (ref 6.0–8.5)
eGFR: 70 mL/min/{1.73_m2} (ref >59)

## 2023-11-16 LAB — URINALYSIS, ROUTINE W REFLEX MICROSCOPIC
Bilirubin, UA: NEGATIVE
Glucose, UA: NEGATIVE
Nitrite, UA: NEGATIVE
RBC, UA: NEGATIVE
Specific Gravity, UA: 1.02 (ref 1.005–1.030)
Urobilinogen, Ur: 0.2 mg/dL (ref 0.2–1.0)
pH, UA: 6 (ref 5.0–7.5)

## 2023-11-16 LAB — HEMOGLOBIN A1C
Est. average glucose Bld gHb Est-mCnc: 151 mg/dL
Hgb A1c MFr Bld: 6.9 % — ABNORMAL HIGH (ref 4.8–5.6)

## 2023-11-16 LAB — MICROSCOPIC EXAMINATION
Bacteria, UA: NONE SEEN
Casts: NONE SEEN /[LPF]

## 2023-11-16 LAB — LIPID PANEL
Chol/HDL Ratio: 1.9 {ratio} (ref 0.0–4.4)
Cholesterol, Total: 191 mg/dL (ref 100–199)
HDL: 98 mg/dL (ref >39)
LDL Chol Calc (NIH): 72 mg/dL (ref 0–99)
Triglycerides: 124 mg/dL (ref 0–149)
VLDL Cholesterol Cal: 21 mg/dL (ref 5–40)

## 2023-11-16 LAB — TSH+FREE T4
Free T4: 1.17 ng/dL (ref 0.82–1.77)
TSH: 2.72 u[IU]/mL (ref 0.450–4.500)

## 2023-12-07 DIAGNOSIS — M546 Pain in thoracic spine: Secondary | ICD-10-CM | POA: Diagnosis not present

## 2023-12-07 DIAGNOSIS — M9905 Segmental and somatic dysfunction of pelvic region: Secondary | ICD-10-CM | POA: Diagnosis not present

## 2023-12-07 DIAGNOSIS — M9903 Segmental and somatic dysfunction of lumbar region: Secondary | ICD-10-CM | POA: Diagnosis not present

## 2023-12-07 DIAGNOSIS — M5442 Lumbago with sciatica, left side: Secondary | ICD-10-CM | POA: Diagnosis not present

## 2023-12-07 DIAGNOSIS — M955 Acquired deformity of pelvis: Secondary | ICD-10-CM | POA: Diagnosis not present

## 2023-12-07 DIAGNOSIS — M9902 Segmental and somatic dysfunction of thoracic region: Secondary | ICD-10-CM | POA: Diagnosis not present

## 2023-12-28 DIAGNOSIS — M5442 Lumbago with sciatica, left side: Secondary | ICD-10-CM | POA: Diagnosis not present

## 2023-12-28 DIAGNOSIS — M9905 Segmental and somatic dysfunction of pelvic region: Secondary | ICD-10-CM | POA: Diagnosis not present

## 2023-12-28 DIAGNOSIS — M546 Pain in thoracic spine: Secondary | ICD-10-CM | POA: Diagnosis not present

## 2023-12-28 DIAGNOSIS — M955 Acquired deformity of pelvis: Secondary | ICD-10-CM | POA: Diagnosis not present

## 2023-12-28 DIAGNOSIS — M9903 Segmental and somatic dysfunction of lumbar region: Secondary | ICD-10-CM | POA: Diagnosis not present

## 2023-12-28 DIAGNOSIS — M9902 Segmental and somatic dysfunction of thoracic region: Secondary | ICD-10-CM | POA: Diagnosis not present

## 2024-02-01 DIAGNOSIS — M546 Pain in thoracic spine: Secondary | ICD-10-CM | POA: Diagnosis not present

## 2024-02-01 DIAGNOSIS — M9902 Segmental and somatic dysfunction of thoracic region: Secondary | ICD-10-CM | POA: Diagnosis not present

## 2024-02-01 DIAGNOSIS — M955 Acquired deformity of pelvis: Secondary | ICD-10-CM | POA: Diagnosis not present

## 2024-02-01 DIAGNOSIS — M9905 Segmental and somatic dysfunction of pelvic region: Secondary | ICD-10-CM | POA: Diagnosis not present

## 2024-02-01 DIAGNOSIS — M9903 Segmental and somatic dysfunction of lumbar region: Secondary | ICD-10-CM | POA: Diagnosis not present

## 2024-02-01 DIAGNOSIS — M5442 Lumbago with sciatica, left side: Secondary | ICD-10-CM | POA: Diagnosis not present

## 2024-02-17 ENCOUNTER — Ambulatory Visit
Admission: RE | Admit: 2024-02-17 | Discharge: 2024-02-17 | Disposition: A | Discharge status: Home / Self Care | Payer: Medicare Other | Source: Ambulatory Visit | Attending: Internal Medicine | Admitting: Internal Medicine

## 2024-02-17 DIAGNOSIS — Z1231 Encounter for screening mammogram for malignant neoplasm of breast: Secondary | ICD-10-CM | POA: Diagnosis not present

## 2024-03-07 DIAGNOSIS — M546 Pain in thoracic spine: Secondary | ICD-10-CM | POA: Diagnosis not present

## 2024-03-07 DIAGNOSIS — M9902 Segmental and somatic dysfunction of thoracic region: Secondary | ICD-10-CM | POA: Diagnosis not present

## 2024-03-07 DIAGNOSIS — M955 Acquired deformity of pelvis: Secondary | ICD-10-CM | POA: Diagnosis not present

## 2024-03-07 DIAGNOSIS — M5442 Lumbago with sciatica, left side: Secondary | ICD-10-CM | POA: Diagnosis not present

## 2024-03-07 DIAGNOSIS — M9905 Segmental and somatic dysfunction of pelvic region: Secondary | ICD-10-CM | POA: Diagnosis not present

## 2024-03-07 DIAGNOSIS — M9903 Segmental and somatic dysfunction of lumbar region: Secondary | ICD-10-CM | POA: Diagnosis not present

## 2024-04-19 ENCOUNTER — Ambulatory Visit: Payer: Medicare Other

## 2024-05-10 ENCOUNTER — Telehealth: Payer: Self-pay | Admitting: Internal Medicine

## 2024-05-10 NOTE — Telephone Encounter (Signed)
 Copied from CRM [id number]. Topic: Medicare AWV >> May 10, 2024 10:21 AM Juliana Ocean wrote: Reason for CRM: LVM 05/10/2024 to schedule AWV. Please schedule office or virtual visits  Kelsey Pacheco; Care Guide Ambulatory Clinical Support [place] [facility] Direct Dial: [phone]

## 2024-06-29 ENCOUNTER — Ambulatory Visit
Admission: RE | Admit: 2024-06-29 | Discharge: 2024-06-29 | Disposition: A | Discharge status: Home / Self Care | Source: Ambulatory Visit | Attending: Emergency Medicine | Admitting: Emergency Medicine

## 2024-06-29 VITALS — BP 161/93 | HR 77 | Temp 98.8°F | Resp 17

## 2024-06-29 DIAGNOSIS — T148XXA Other injury of unspecified body region, initial encounter: Secondary | ICD-10-CM | POA: Diagnosis not present

## 2024-06-29 MED ORDER — MUPIROCIN 2 % EX OINT: 1.0000 | TOPICAL_OINTMENT | Freq: Two times a day (BID) | CUTANEOUS | 0 refills | Status: AC

## 2024-06-29 NOTE — Discharge Instructions (Signed)
 Daily wound care,use mupirocin  ointment, follow up with PCP for recheck of wound next week-call for appt

## 2024-06-29 NOTE — ED Provider Notes (Signed)
 [facility]    CSN: [id number] Arrival date & time: 06/29/24  9161      History   Chief Complaint Chief Complaint  Patient presents with   Abrasion    Entered by patient    HPI Kelsey Pacheco is a 83 y.o. female.   83 year old female, Kelsey Pacheco, presents to urgent care for evaluation of left arm abrasion that occurred 2 weeks ago after playing with her dog and it's nail tearing her skin.  Patient concerned area is not healing, no pain to area.  Tetanus is UTD  The history is provided by the patient. No language interpreter was used.    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 Renita   BREAST BIOPSY Right 2006   bx/clip-neg   BREAST BIOPSY Right 08/19/2022   Right Breast stereo BX, coil clip, DENSE STROMAL FIBROSIS - NEGATIVE FOR ATYPIA   CATARACT EXTRACTION W/PHACO Right 03/16/2018   Procedure: CATARACT EXTRACTION PHACO AND INTRAOCULAR LENS PLACEMENT (IOC) RIGHT;  Surgeon: Mittie Gaskin, MD;  Location: [facility];  Service: Ophthalmology;  Laterality: Right;  requests early   CATARACT EXTRACTION W/PHACO Left 05/19/2023   Procedure: CATARACT EXTRACTION PHACO AND INTRAOCULAR LENS PLACEMENT (IOC) LEFT 10.12 00:55.9;  Surgeon:  Mittie Gaskin, MD;  Location: [facility];  Service: Ophthalmology;  Laterality: Left;   Cologuard home test  12/2015   Negative   RETINAL DETACHMENT SURGERY Right 2018    OB History     Gravida  1   Para  1   Term      Preterm      AB      Living         SAB      IAB      Ectopic      Multiple      Live Births               Home Medications    Prior to Admission medications   Medication Sig Start Date End Date Taking? Authorizing Provider  Ascorbic Acid (VITAMIN C) 100 MG tablet Take 1 tablet by mouth daily.   Yes [provider]  atorvastatin  (LIPITOR) 40 MG tablet Take 1 tablet (40 mg total) by mouth daily. 11/15/23  Yes [name], MD  Calcium  Carbonate (CALCIUM  600 PO) Take 1 tablet by mouth daily.   Yes [provider]  ferrous sulfate 325 (65 FE) MG EC tablet Take 325 mg by mouth 3 (three) times daily with meals.   Yes [provider]  levothyroxine  (SYNTHROID ) 100 MCG tablet Take 1 tablet (100 mcg total) by mouth daily before breakfast. 11/15/23  Yes [name], MD  LYSINE PO  Take by mouth as needed.   Yes [provider]  MULTIPLE VITAMIN PO Take 1 tablet by mouth daily.   Yes [provider]  mupirocin  ointment (BACTROBAN ) 2 % Apply 1 Application topically 2 (two) times daily for 7 days. Left forearm 06/29/24 07/06/24 Yes Lerlene Treadwell, Rilla, NP  Vitamin D , Cholecalciferol, 1000 UNITS TABS Take 1 tablet by mouth daily.   Yes [provider]  Vitamin E 100 UNITS TABS Take 1 capsule by mouth daily.   Yes [provider]    Family History Family History  Problem Relation Age of Onset   Dementia Father    Heart disease Father    Heart attack Mother    Breast cancer Neg Hx     Social History Social History   Tobacco Use   Smoking status: Never   Smokeless tobacco: Never  Vaping Use   Vaping status: Never Used  Substance Use Topics   Alcohol use: No    Drug use: No     Allergies   Fosamax  [alendronate ]   Review of Systems Review of Systems  Constitutional:  Negative for fever.  Skin:  Positive for color change and wound.  All other systems reviewed and are negative.    Physical Exam Triage Vital Signs ED Triage Vitals [06/29/24 0915]  Encounter Vitals Group     BP (!) 161/93     Girls Systolic BP Percentile      Girls Diastolic BP Percentile      Boys Systolic BP Percentile      Boys Diastolic BP Percentile      Pulse Rate 77     Resp 17     Temp 98.8 F (37.1 C)     Temp Source Oral     SpO2 96 %     Weight      Height      Head Circumference      Peak Flow      Pain Score 0     Pain Loc      Pain Education      Exclude from Growth Chart    No data found.  Updated Vital Signs BP (!) 161/93 (BP Location: Right Arm)   Pulse 77   Temp 98.8 F (37.1 C) (Oral)   Resp 17   SpO2 96%   Visual Acuity Right Eye Distance:   Left Eye Distance:   Bilateral Distance:    Right Eye Near:   Left Eye Near:    Bilateral Near:     Physical Exam Vitals and nursing note reviewed.  Skin:    General: Skin is warm.     Capillary Refill: Capillary refill takes less than 2 seconds.     Findings: Wound present.     Comments: Left forearm healing abrasion, see photo, no purulent draiange, no streaking ~ 2x1 cm  Neurological:     General: No focal deficit present.     Mental Status: She is alert and oriented to person, place, and time.     GCS: GCS eye subscore is 4. GCS verbal subscore is 5. GCS motor subscore is 6.  Psychiatric:        Attention and Perception: Attention normal.        Mood and Affect: Mood normal.        Speech: Speech normal.        Behavior: Behavior is cooperative.      UC Treatments / Results  Labs (all labs ordered are listed,  but only abnormal results are displayed) Labs Reviewed - No data to display  EKG   Radiology No results found.  Procedures Procedures (including critical  care time)  Medications Ordered in UC Medications - No data to display  Initial Impression / Assessment and Plan / UC Course  I have reviewed the triage vital signs and the nursing notes.  Pertinent labs & imaging results that were available during my care of the patient were reviewed by me and considered in my medical decision making (see chart for details).    Discussed exam findings and plan of care with patient, scripted Mupirocin , strict go to ER precautions given.   Patient verbalized understanding to this provider.  Ddx: Abrasion, wound, contusion, cellulitis Final Clinical Impressions(s) / UC Diagnoses   Final diagnoses:  Abrasion     Discharge Instructions      Daily wound care,use mupirocin  ointment, follow up with PCP for recheck of wound next week-call for appt     ED Prescriptions     Medication Sig Dispense Auth. Provider   mupirocin  ointment (BACTROBAN ) 2 % Apply 1 Application topically 2 (two) times daily for 7 days. Left forearm 14 g Brelee Renk, NP      PDMP not reviewed this encounter.   Aminta Loose, NP 06/29/24 1124

## 2024-06-29 NOTE — ED Triage Notes (Signed)
 Patient states that she was playing with her dog 2 weeks ago and the dog tore her skin on her left arm. Patient states that the area isnt healing. Patient states that theres no pain in the area.

## 2024-08-18 ENCOUNTER — Emergency Department

## 2024-08-18 ENCOUNTER — Other Ambulatory Visit: Payer: Self-pay

## 2024-08-18 ENCOUNTER — Observation Stay
Admission: EM | Admit: 2024-08-18 | Discharge: 2024-08-19 | Disposition: A | Discharge status: Home / Self Care | Attending: Obstetrics and Gynecology | Admitting: Obstetrics and Gynecology

## 2024-08-18 DIAGNOSIS — R7303 Prediabetes: Secondary | ICD-10-CM | POA: Diagnosis not present

## 2024-08-18 DIAGNOSIS — K449 Diaphragmatic hernia without obstruction or gangrene: Secondary | ICD-10-CM | POA: Diagnosis not present

## 2024-08-18 DIAGNOSIS — R231 Pallor: Secondary | ICD-10-CM | POA: Diagnosis not present

## 2024-08-18 DIAGNOSIS — K5730 Diverticulosis of large intestine without perforation or abscess without bleeding: Secondary | ICD-10-CM | POA: Diagnosis not present

## 2024-08-18 DIAGNOSIS — R1031 Right lower quadrant pain: Secondary | ICD-10-CM | POA: Insufficient documentation

## 2024-08-18 DIAGNOSIS — E782 Mixed hyperlipidemia: Secondary | ICD-10-CM | POA: Diagnosis present

## 2024-08-18 DIAGNOSIS — R1084 Generalized abdominal pain: Secondary | ICD-10-CM | POA: Diagnosis not present

## 2024-08-18 DIAGNOSIS — E039 Hypothyroidism, unspecified: Secondary | ICD-10-CM | POA: Diagnosis not present

## 2024-08-18 DIAGNOSIS — T68XXXA Hypothermia, initial encounter: Principal | ICD-10-CM | POA: Diagnosis present

## 2024-08-18 DIAGNOSIS — A419 Sepsis, unspecified organism: Secondary | ICD-10-CM

## 2024-08-18 DIAGNOSIS — K862 Cyst of pancreas: Secondary | ICD-10-CM | POA: Diagnosis not present

## 2024-08-18 DIAGNOSIS — Z79899 Other long term (current) drug therapy: Secondary | ICD-10-CM | POA: Insufficient documentation

## 2024-08-18 DIAGNOSIS — I10 Essential (primary) hypertension: Secondary | ICD-10-CM | POA: Diagnosis not present

## 2024-08-18 DIAGNOSIS — R531 Weakness: Secondary | ICD-10-CM | POA: Insufficient documentation

## 2024-08-18 DIAGNOSIS — R1030 Lower abdominal pain, unspecified: Secondary | ICD-10-CM

## 2024-08-18 DIAGNOSIS — N390 Urinary tract infection, site not specified: Secondary | ICD-10-CM | POA: Diagnosis not present

## 2024-08-18 DIAGNOSIS — X31XXXA Exposure to excessive natural cold, initial encounter: Secondary | ICD-10-CM | POA: Insufficient documentation

## 2024-08-18 DIAGNOSIS — K8020 Calculus of gallbladder without cholecystitis without obstruction: Secondary | ICD-10-CM | POA: Diagnosis not present

## 2024-08-18 DIAGNOSIS — R1032 Left lower quadrant pain: Secondary | ICD-10-CM | POA: Diagnosis present

## 2024-08-18 DIAGNOSIS — E118 Type 2 diabetes mellitus with unspecified complications: Secondary | ICD-10-CM | POA: Diagnosis present

## 2024-08-18 LAB — TROPONIN I (HIGH SENSITIVITY)
Troponin I (High Sensitivity): 3 ng/L (ref <18)
Troponin I (High Sensitivity): 3 ng/L (ref <18)

## 2024-08-18 LAB — URINALYSIS, ROUTINE W REFLEX MICROSCOPIC
Bacteria, UA: NONE SEEN
Bilirubin Urine: NEGATIVE
Glucose, UA: 50 mg/dL — AB
Hgb urine dipstick: NEGATIVE
Ketones, ur: 5 mg/dL — AB
Nitrite: NEGATIVE
Protein, ur: NEGATIVE mg/dL
Specific Gravity, Urine: 1.033 — ABNORMAL HIGH (ref 1.005–1.030)
Squamous Epithelial / HPF: 0 /HPF (ref 0–5)
pH: 7 (ref 5.0–8.0)

## 2024-08-18 LAB — CBC WITH DIFFERENTIAL/PLATELET
Abs Immature Granulocytes: 0.02 K/uL (ref 0.00–0.07)
Basophils Absolute: 0 K/uL (ref 0.0–0.1)
Basophils Relative: 1 %
Eosinophils Absolute: 0 K/uL (ref 0.0–0.5)
Eosinophils Relative: 1 %
HCT: 45.2 % (ref 36.0–46.0)
Hemoglobin: 15 g/dL (ref 12.0–15.0)
Immature Granulocytes: 0 %
Lymphocytes Relative: 25 %
Lymphs Abs: 1.6 K/uL (ref 0.7–4.0)
MCH: 30.8 pg (ref 26.0–34.0)
MCHC: 33.2 g/dL (ref 30.0–36.0)
MCV: 92.8 fL (ref 80.0–100.0)
Monocytes Absolute: 0.5 K/uL (ref 0.1–1.0)
Monocytes Relative: 7 %
Neutro Abs: 4.3 K/uL (ref 1.7–7.7)
Neutrophils Relative %: 66 %
Platelets: 206 K/uL (ref 150–400)
RBC: 4.87 MIL/uL (ref 3.87–5.11)
RDW: 12.1 % (ref 11.5–15.5)
WBC: 6.4 K/uL (ref 4.0–10.5)
nRBC: 0 % (ref 0.0–0.2)

## 2024-08-18 LAB — RESP PANEL BY RT-PCR (RSV, FLU A&B, COVID)  RVPGX2
Influenza A by PCR: NEGATIVE
Influenza B by PCR: NEGATIVE
Resp Syncytial Virus by PCR: NEGATIVE
SARS Coronavirus 2 by RT PCR: NEGATIVE

## 2024-08-18 LAB — LIPASE, BLOOD: Lipase: 10 U/L — ABNORMAL LOW (ref 11–51)

## 2024-08-18 LAB — LACTIC ACID, PLASMA
Lactic Acid, Venous: 2.5 mmol/L (ref 0.5–1.9)
Lactic Acid, Venous: 1.6 mmol/L (ref 0.5–1.9)

## 2024-08-18 LAB — COMPREHENSIVE METABOLIC PANEL WITH GFR
ALT: 24 U/L (ref 0–44)
AST: 32 U/L (ref 15–41)
Albumin: 4.3 g/dL (ref 3.5–5.0)
Alkaline Phosphatase: 83 U/L (ref 38–126)
Anion gap: 15 (ref 5–15)
BUN: 14 mg/dL (ref 8–23)
CO2: 23 mmol/L (ref 22–32)
Calcium: 9 mg/dL (ref 8.9–10.3)
Chloride: 104 mmol/L (ref 98–111)
Creatinine, Ser: 0.69 mg/dL (ref 0.44–1.00)
GFR, Estimated: >60 mL/min (ref >60)
Glucose, Bld: 177 mg/dL — ABNORMAL HIGH (ref 70–99)
Potassium: 3.2 mmol/L — ABNORMAL LOW (ref 3.5–5.1)
Sodium: 142 mmol/L (ref 135–145)
Total Bilirubin: 1 mg/dL (ref 0.0–1.2)
Total Protein: 7.3 g/dL (ref 6.5–8.1)

## 2024-08-18 LAB — TSH: TSH: 3.557 u[IU]/mL (ref 0.350–4.500)

## 2024-08-18 MED ORDER — IOHEXOL 300 MG/ML  SOLN
100.0000 mL | Freq: Once | INTRAMUSCULAR | Status: AC | PRN
  Administered 2024-08-18: 100 mL via INTRAVENOUS

## 2024-08-18 MED ORDER — SODIUM CHLORIDE 0.9 % IV SOLN
2.0000 g | INTRAVENOUS | Status: DC
  Administered 2024-08-18 – 2024-08-19 (×2): 2 g via INTRAVENOUS
  Filled 2024-08-18 (×2): qty 20

## 2024-08-18 MED ORDER — ENOXAPARIN SODIUM 40 MG/0.4ML IJ SOSY
40.0000 mg | PREFILLED_SYRINGE | INTRAMUSCULAR | Status: DC
  Administered 2024-08-18: 40 mg via SUBCUTANEOUS
  Filled 2024-08-18: qty 0.4

## 2024-08-18 MED ORDER — LACTATED RINGERS IV BOLUS
1000.0000 mL | Freq: Once | INTRAVENOUS | Status: AC
  Administered 2024-08-18: 1000 mL via INTRAVENOUS

## 2024-08-18 MED ORDER — ONDANSETRON HCL 4 MG/2ML IJ SOLN: 4.0000 mg | Freq: Four times a day (QID) | INTRAMUSCULAR | Status: DC | PRN

## 2024-08-18 MED ORDER — OXYCODONE HCL 5 MG PO TABS: 5.0000 mg | ORAL_TABLET | ORAL | Status: DC | PRN

## 2024-08-18 MED ORDER — ACETAMINOPHEN 325 MG PO TABS
650.0000 mg | ORAL_TABLET | Freq: Four times a day (QID) | ORAL | Status: DC | PRN
  Administered 2024-08-18: 650 mg via ORAL
  Filled 2024-08-18: qty 2

## 2024-08-18 MED ORDER — ACETAMINOPHEN 650 MG RE SUPP: 650.0000 mg | Freq: Four times a day (QID) | RECTAL | Status: DC | PRN

## 2024-08-18 MED ORDER — ONDANSETRON HCL 4 MG PO TABS: 4.0000 mg | ORAL_TABLET | Freq: Four times a day (QID) | ORAL | Status: DC | PRN

## 2024-08-18 MED ORDER — POLYETHYLENE GLYCOL 3350 17 G PO PACK: 17.0000 g | PACK | Freq: Every day | ORAL | Status: DC | PRN

## 2024-08-18 NOTE — ED Provider Notes (Signed)
 [facility] Provider Note    Event Date/Time   First MD Initiated Contact with Patient 08/18/24 0422     (approximate)   History   Abdominal Pain and Weakness   HPI  Kelsey Pacheco is a 83 y.o. female with a history of hyperlipidemia, hypothyroidism, prediabetes, and osteoporosis who presents with lower abdominal pain for the last 2 days, described as an ache, bilateral, somewhat worse on the right.  It is not associated with any nausea, vomiting, or diarrhea.  The patient states that yesterday evening she went to walk the dog and suddenly started to feel very lightheaded and weak.  After short time, she started to feel better, however she had a second episode like this early this morning and decided to come in to be seen.  I reviewed the past medical records.  The patient's most recent outpatient encounter was on 8/7 in urgent care for an abrasion on her left arm which was not healing.   Physical Exam   Triage Vital Signs: ED Triage Vitals  Encounter Vitals Group     BP --      Girls Systolic BP Percentile --      Girls Diastolic BP Percentile --      Boys Systolic BP Percentile --      Boys Diastolic BP Percentile --      Pulse --      Resp --      Temp --      Temp src --      SpO2 --      Weight 08/18/24 0424 148 lb (67.1 kg)     Height --      Head Circumference --      Peak Flow --      Pain Score 08/18/24 0421 2     Pain Loc --      Pain Education --      Exclude from Growth Chart --     Most recent vital signs: Vitals:   08/18/24 0550 08/18/24 0652  BP:  119/87  Pulse: (!) 54 60  Resp: 16 20  Temp: (!) 94.1 F (34.5 C) (!) 94.5 F (34.7 C)  SpO2: 100% 100%     General: Alert, relatively well-appearing, no distress.  CV:  Good peripheral perfusion.  Resp:  Normal effort.  Abd:  No distention.  Soft with mild right lower quadrant tenderness. Other:  Slightly dry mucous membranes.   ED Results / Procedures / Treatments    Labs (all labs ordered are listed, but only abnormal results are displayed) Labs Reviewed  COMPREHENSIVE METABOLIC PANEL WITH GFR - Abnormal; Notable for the following components:      Result Value   Potassium 3.2 (*)    Glucose, Bld 177 (*)    All other components within normal limits  LACTIC ACID, PLASMA - Abnormal; Notable for the following components:   Lactic Acid, Venous 2.5 (*)    All other components within normal limits  CBC WITH DIFFERENTIAL/PLATELET  LACTIC ACID, PLASMA  LIPASE, BLOOD  URINALYSIS, ROUTINE W REFLEX MICROSCOPIC  TROPONIN I (HIGH SENSITIVITY)  TROPONIN I (HIGH SENSITIVITY)     EKG  ED ECG REPORT I, Waylon Cassis, the attending physician, personally viewed and interpreted this ECG.  Date: 08/18/2024 EKG Time: 0422 Rate: 57 Rhythm: normal sinus rhythm QRS Axis: normal Intervals: normal ST/T Wave abnormalities: normal Narrative Interpretation: no evidence of acute ischemia    RADIOLOGY  CT abdomen/pelvis: I independently viewed  and interpreted the images; there are no dilated bowel loops or any free air or free fluid.  Radiology report indicates the following:  IMPRESSION:  1. No acute findings in the abdomen or pelvis.  2. Gallstones without cholecystitis.  3. Cystic lesion in the pancreatic head/uncinate process measuring 2.0 x 1.0 cm  without concerning features; no main pancreatic duct dilation. According to  consensus criteria, follow-up imaging in 24 months with pancreas protocol MRI  or CT is advised.  4. Moderate-sized hiatal hernia.  5. Cystic structure posterior to the uterus. Simple-appearing but not  adequately characterized. Recommend follow-up with pelvic sonogram.    PROCEDURES:  Critical Care performed: No  Procedures   MEDICATIONS ORDERED IN ED: Medications  iohexol  (OMNIPAQUE ) 300 MG/ML solution 100 mL (100 mLs Intravenous Contrast Given 08/18/24 0529)  lactated ringers  bolus 1,000 mL (0 mLs Intravenous  Stopped 08/18/24 0649)     IMPRESSION / MDM / ASSESSMENT AND PLAN / ED COURSE  I reviewed the triage vital signs and the nursing notes.  83 year old female with PMH as noted above presents with lower abdominal pain for the last 2 days, with 2 episodes today of dizziness and generalized weakness.  On exam the patient is significantly hypothermic.  She is borderline bradycardic.  Other vital signs are normal.  Abdomen soft with mild right lower quadrant tenderness.  Differential diagnosis includes, but is not limited to, diverticulitis, colitis, appendicitis, SBO, volvulus, gastroenteritis, UTI, pyelonephritis, other infection, sepsis.  We will obtain lab workup, CT abdomen/pelvis, and reassess.  The patient is on the Bair hugger due to her hypothermia.  Patient's presentation is most consistent with acute presentation with potential threat to life or bodily function.  The patient is on the cardiac monitor to evaluate for evidence of arrhythmia and/or significant heart rate changes.   ----------------------------------------- 7:25 AM on 08/18/2024 -----------------------------------------  CT shows multiple chronic findings but no acute abnormality to explain the patient's symptoms.  Urinalysis is still pending.  Other labs are reassuring.  CBC shows no leukocytosis.  CMP is unremarkable.  Initial lactic was mildly elevated but the repeat is improved.  Given the hypothermia and concern for possible sepsis the patient will need admission for further workup and management.  FINAL CLINICAL IMPRESSION(S) / ED DIAGNOSES   Final diagnoses:  Hypothermia, initial encounter  Generalized weakness  Lower abdominal pain     Rx / DC Orders   ED Discharge Orders     None        Note:  This document was prepared using Dragon voice recognition software and may include unintentional dictation errors.    [name], MD 08/18/24 [phone]

## 2024-08-18 NOTE — H&P (Signed)
 History and Physical    Kelsey Pacheco:[id number] DOB: [birth year] DOA: 08/18/2024  DOS: the patient was seen and examined on 08/18/2024  PCP: [name], MD   Patient coming from: Home  I have personally briefly reviewed patient's old medical records in [facility]  Chief Complaint: Right-sided abdominal pain  HPI: Kelsey Pacheco is a pleasant 83 y.o. female with medical history significant for hypothyroidism and hyperlipidemia, prediabetes and osteoporosis who presented to ED complaining of abdominal pain for the last 2 days.  Patient stated that the pain is in the right lower quadrant, feeling like ache, associate with some sweating.  She could not quantify the intensity of the pain.  She felt lightheaded and felt weak and then she started feeling better.  She came into the emergency room for evaluation.  She never had this kind of problems before.  ED Course: Upon arrival to the ED, patient is found to be hypothermic with rectal temperature of 92 F and she had initial lactate of 2.5.  All the  other workup including CT scan of the abdomen was negative for acute pathology.  Hospitalist service was consulted for evaluation for admission for hypothermia and lactic acidosis.  Review of Systems:  ROS  All other systems negative except as noted in the HPI.  Past Medical History:  Diagnosis Date   Arthritis    lower back   Hyperlipidemia    Hypothyroidism    Osteoporosis    Sciatica of left side    Thyroid  disease    Vitamin D  deficiency     Past Surgical History:  Procedure Laterality Date   BASAL CELL CARCINOMA EXCISION  06/04/2011   [facility] (Mohs procedure) left orbital Dr. Renita   BREAST BIOPSY Right 2006   bx/clip-neg   BREAST BIOPSY Right 08/19/2022   Right Breast stereo BX, coil clip, DENSE STROMAL FIBROSIS - NEGATIVE FOR ATYPIA   CATARACT EXTRACTION W/PHACO Right 03/16/2018   Procedure: CATARACT EXTRACTION PHACO AND INTRAOCULAR LENS PLACEMENT (IOC)  RIGHT;  Surgeon: Mittie Gaskin, MD;  Location: [facility];  Service: Ophthalmology;  Laterality: Right;  requests early   CATARACT EXTRACTION W/PHACO Left 05/19/2023   Procedure: CATARACT EXTRACTION PHACO AND INTRAOCULAR LENS PLACEMENT (IOC) LEFT 10.12 00:55.9;  Surgeon: Mittie Gaskin, MD;  Location: [facility];  Service: Ophthalmology;  Laterality: Left;   Cologuard home test  12/2015   Negative   RETINAL DETACHMENT SURGERY Right 2018     reports that she has never smoked. She has never used smokeless tobacco. She reports that she does not drink alcohol and does not use drugs.  Allergies  Allergen Reactions   Fosamax  [Alendronate ] Other (See Comments)    Body aches    Family History  Problem Relation Age of Onset   Dementia Father    Heart disease Father    Heart attack Mother    Breast cancer Neg Hx     Prior to Admission medications   Medication Sig Start Date End Date Taking? Authorizing Provider  Ascorbic Acid (VITAMIN C) 100 MG tablet Take 1 tablet by mouth daily.    [provider]  atorvastatin  (LIPITOR) 40 MG tablet Take 1 tablet (40 mg total) by mouth daily. 11/15/23   Berglund, Laura H, MD  Calcium  Carbonate (CALCIUM  600 PO) Take 1 tablet by mouth daily.    [provider]  ferrous sulfate 325 (65 FE) MG EC tablet Take 325 mg by mouth 3 (three) times daily with meals.  [provider]  levothyroxine  (SYNTHROID ) 100 MCG tablet Take 1 tablet (100 mcg total) by mouth daily before breakfast. 11/15/23   [name], MD  LYSINE PO Take by mouth as needed.    [provider]  MULTIPLE VITAMIN PO Take 1 tablet by mouth daily.    [provider]  Vitamin D , Cholecalciferol, 1000 UNITS TABS Take 1 tablet by mouth daily.    [provider]  Vitamin E 100 UNITS TABS Take 1 capsule by mouth daily.    [provider]    Physical Exam: Vitals:   08/18/24 0652 08/18/24 0800 08/18/24  0827 08/18/24 0900  BP: 119/87 (!) 135/59    Pulse: 60 71    Resp: 20 14    Temp: (!) 94.5 F (34.7 C)  (!) 97.5 F (36.4 C)   TempSrc: Oral  Oral   SpO2: 100% 100%    Weight:      Height:    5' 7.01 (1.702 m)    Physical Exam   Constitutional: Alert, awake, calm, comfortable HEENT: Neck supple Respiratory: Clear to auscultation B/L, no wheezing, no rales.  Cardiovascular: Regular rate and rhythm, no murmurs / rubs / gallops. No extremity edema. 2+ pedal pulses. No carotid bruits.  Abdomen: Soft, no tenderness, Bowel sounds positive.  Musculoskeletal: no clubbing / cyanosis. Good ROM, no contractures. Normal muscle tone.  Skin: no rashes, lesions, ulcers. Neurologic: CN 2-12 grossly intact. Sensation intact, No focal deficit identified Psychiatric: Alert and oriented x 3. Normal mood.    Labs on Admission: I have personally reviewed following labs and imaging studies  CBC: Recent Labs  Lab 08/18/24 0422  WBC 6.4  NEUTROABS 4.3  HGB 15.0  HCT 45.2  MCV 92.8  PLT 206   Basic Metabolic Panel: Recent Labs  Lab 08/18/24 0422  NA 142  K 3.2*  CL 104  CO2 23  GLUCOSE 177*  BUN 14  CREATININE 0.69  CALCIUM  9.0   GFR: Estimated Creatinine Clearance: 51.8 mL/min (by C-G formula based on SCr of 0.69 mg/dL). Liver Function Tests: Recent Labs  Lab 08/18/24 0422  AST 32  ALT 24  ALKPHOS 83  BILITOT 1.0  PROT 7.3  ALBUMIN 4.3   Recent Labs  Lab 08/18/24 0422  LIPASE 10*   No results for input(s): AMMONIA in the last 168 hours. Coagulation Profile: No results for input(s): INR, PROTIME in the last 168 hours. Cardiac Enzymes: Recent Labs  Lab 08/18/24 0454 08/18/24 0651  TROPONINIHS 3 3   BNP (last 3 results) No results for input(s): BNP in the last 8760 hours. HbA1C: No results for input(s): HGBA1C in the last 72 hours. CBG: No results for input(s): GLUCAP in the last 168 hours. Lipid Profile: No results for input(s): CHOL, HDL,  LDLCALC, TRIG, CHOLHDL, LDLDIRECT in the last 72 hours. Thyroid  Function Tests: No results for input(s): TSH, T4TOTAL, FREET4, T3FREE, THYROIDAB in the last 72 hours. Anemia Panel: No results for input(s): VITAMINB12, FOLATE, FERRITIN, TIBC, IRON, RETICCTPCT in the last 72 hours. Urine analysis:    Component Value Date/Time   COLORURINE STRAW (A) 08/18/2024 0735   APPEARANCEUR CLEAR (A) 08/18/2024 0735   APPEARANCEUR Clear 11/15/2023 0859   LABSPEC 1.033 (H) 08/18/2024 0735   PHURINE 7.0 08/18/2024 0735   GLUCOSEU 50 (A) 08/18/2024 0735   HGBUR NEGATIVE 08/18/2024 0735   BILIRUBINUR NEGATIVE 08/18/2024 0735   BILIRUBINUR Negative 11/15/2023 0859   KETONESUR 5 (A) 08/18/2024 0735   PROTEINUR NEGATIVE 08/18/2024  0735   UROBILINOGEN 0.2 10/31/2020 0909   NITRITE NEGATIVE 08/18/2024 0735   LEUKOCYTESUR SMALL (A) 08/18/2024 0735    Radiological Exams on Admission: I have personally reviewed images CT ABDOMEN PELVIS W CONTRAST Result Date: 08/18/2024 EXAM: CT ABDOMEN AND PELVIS WITH CONTRAST 08/18/2024 05:40:45 AM TECHNIQUE: CT of the abdomen and pelvis was performed with the administration of 100 mL of iohexol  (OMNIPAQUE ) 300 MG/ML solution. Multiplanar reformatted images are provided for review. Automated exposure control, iterative reconstruction, and/or weight-based adjustment of the mA/kV was utilized to reduce the radiation dose to as low as reasonably achievable. COMPARISON: None available. CLINICAL HISTORY: Abdominal pain, acute, nonlocalized. Patient C/O lower abdominal pain, bloating and weakness that began on Wednesday. FINDINGS: LOWER CHEST: There is mild chronic postinflammatory changes within the lung bases with parenchymal banding and interstitial reticulation. No acute findings. LIVER: There are 2 small low-density foci within the liver measuring up to 6 mm. These are too small to characterize. No suspicious liver lesion. GALLBLADDER AND BILE DUCTS:  Gallstones. No gallbladder wall thickening or pericholecystic inflammation. Common bile duct measures up to 7 mm. No obstructing stone or mass identified. No intrahepatic bile duct dilatation. SPLEEN: No acute abnormality. PANCREAS: Cystic lesion containing an internal septation without enhancing mural nodule is identified within the head and uncinate process of pancreas measuring 2 x 1.0 cm, image 32/2. No pancreatic main duct dilatation, inflammation or mass. ADRENAL GLANDS: No acute abnormality. KIDNEYS, URETERS AND BLADDER: No stones in the kidneys or ureters. No hydronephrosis. No perinephric or periureteral stranding. Urinary bladder is unremarkable. GI AND BOWEL: Moderate-sized hiatal hernia. No pathologic dilatation of the large or small bowel loops. The cecum and ileocecal valve are in the left hemiabdomen. The appendix is identified left of the midline and appears normal in caliber without periappendiceal soft tissue stranding. No signs of bowel inflammation. Scattered colonic and diverticula noted without signs of acute diverticulitis. Stomach demonstrates no acute abnormality. There is no bowel obstruction. PERITONEUM AND RETROPERITONEUM: No free fluid or fluid collections identified within the abdomen or pelvis. No signs of pneumoperitoneum. No ascites. No free air. VASCULATURE: Aorta is normal in caliber. Aortic atherosclerosis. Patent abdominal vascularity. LYMPH NODES: No lymphadenopathy. 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 Posterior to the uterus is a cystic structure which measures 5.8 x 4.8 x 5.9 cm. This is simple-appearing but not adequately characterized and may be a peritoneal inclusion cyst or adnexal cyst. Follow-up imaging with pelvic sonogram is advised. 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 No acute osseous  abnormality. No focal soft tissue abnormality. IMPRESSION: 1. No acute findings in the abdomen or pelvis. 2. Gallstones without cholecystitis. 3. 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 According to consensus criteria, follow-up imaging in 24 months with pancreas protocol MRI or CT is advised. 4. Moderate-sized hiatal hernia. 5. Cystic structure posterior to the uterus. Simple-appearing but not adequately characterized. Recommend follow-up with pelvic sonogram. Electronically signed by: Waddell Calk MD 08/18/2024 06:27 AM EDT RP Workstation: [id number]    EKG: My personal interpretation of EKG shows: Sinus rhythm no ST elevation    Assessment/Plan Principal Problem:   Hypothermia Active Problems:   Mixed hyperlipidemia   Acquired hypothyroidism   Prediabetes    Assessment and Plan: 83 year old female double/PMH of hyperlipidemia and hypothyroidism who came into hospital  complaining of abdominal discomfort mainly on the right side, also with some sweating and feeling weak.  1.  Hypothermia - She will be placed in observation - Etiology is unclear - Initial rectal temperature was 21 F - She has a history of hypothyroidism and TSH has been sent, pending result - Urine analysis shows small leukocytes, WBC 6-10 - Will start her on antibiotic for possible urinary tract infection  2.  UTI - Will start her on ceftriaxone  and blood cultures will be sent - Will follow the urine culture  3.  Hypothyroidism/HLD - Resume home medications     DVT prophylaxis: Lovenox  Code Status: DNR/DNI Family Communication: None Disposition Plan: Home Consults called: None Admission status: Observation, Med-Surg   Nena Rebel, MD [facility] 08/18/2024, 9:22 AM

## 2024-08-18 NOTE — ED Triage Notes (Addendum)
 Patient coming from home C/O lower abdominal pain, bloating and weakness that began on Wednesday. Patient denies any nausea, vomiting, diarrhea, or urinary symptoms at this time.

## 2024-08-18 NOTE — ED Notes (Signed)
 Pt ambulated to in room toilet with minimal assistance from staff. Pt returned to bed, reconnected to VS monitoring equipment, and bair hugger. Pt's bed is in lowest locked position with bed alarm on, call bell in reach. No other needs expressed at this time. Pt tolerated activity well.

## 2024-08-18 NOTE — Plan of Care (Signed)
  Problem: Education: Goal: Knowledge of General Education information will improve Description: Including pain rating scale, medication(s)/side effects and non-pharmacologic comfort measures Outcome: Progressing   Problem: Health Behavior/Discharge Planning: Goal: Ability to manage health-related needs will improve Outcome: Progressing   Problem: Clinical Measurements: Goal: Ability to maintain clinical measurements within normal limits will improve Outcome: Progressing Goal: Cardiovascular complication will be avoided Outcome: Progressing   Problem: Activity: Goal: Risk for activity intolerance will decrease Outcome: Progressing   Problem: Coping: Goal: Level of anxiety will decrease Outcome: Progressing   Problem: Safety: Goal: Ability to remain free from injury will improve Outcome: Progressing   Problem: Skin Integrity: Goal: Risk for impaired skin integrity will decrease Outcome: Progressing

## 2024-08-19 ENCOUNTER — Observation Stay

## 2024-08-19 DIAGNOSIS — T68XXXA Hypothermia, initial encounter: Secondary | ICD-10-CM | POA: Diagnosis not present

## 2024-08-19 LAB — CBC
HCT: 38.4 % (ref 36.0–46.0)
Hemoglobin: 13.2 g/dL (ref 12.0–15.0)
MCH: 30.9 pg (ref 26.0–34.0)
MCHC: 34.4 g/dL (ref 30.0–36.0)
MCV: 89.9 fL (ref 80.0–100.0)
Platelets: 218 K/uL (ref 150–400)
RBC: 4.27 MIL/uL (ref 3.87–5.11)
RDW: 12.6 % (ref 11.5–15.5)
WBC: 7.2 K/uL (ref 4.0–10.5)
nRBC: 0 % (ref 0.0–0.2)

## 2024-08-19 LAB — COMPREHENSIVE METABOLIC PANEL WITH GFR
ALT: 20 U/L (ref 0–44)
AST: 23 U/L (ref 15–41)
Albumin: 3.9 g/dL (ref 3.5–5.0)
Alkaline Phosphatase: 73 U/L (ref 38–126)
Anion gap: 10 (ref 5–15)
BUN: 12 mg/dL (ref 8–23)
CO2: 25 mmol/L (ref 22–32)
Calcium: 8.9 mg/dL (ref 8.9–10.3)
Chloride: 104 mmol/L (ref 98–111)
Creatinine, Ser: 0.74 mg/dL (ref 0.44–1.00)
GFR, Estimated: >60 mL/min (ref >60)
Glucose, Bld: 122 mg/dL — ABNORMAL HIGH (ref 70–99)
Potassium: 3.7 mmol/L (ref 3.5–5.1)
Sodium: 139 mmol/L (ref 135–145)
Total Bilirubin: 0.8 mg/dL (ref 0.0–1.2)
Total Protein: 6.7 g/dL (ref 6.5–8.1)

## 2024-08-19 LAB — PROTIME-INR
INR: 1 (ref 0.8–1.2)
Prothrombin Time: 13.8 s (ref 11.4–15.2)

## 2024-08-19 NOTE — Discharge Summary (Signed)
 Kelsey Pacheco:[id number] DOB: January 30, 1941 DOA: 08/18/2024  PCP: [name], MD  Admit date: 08/18/2024 Discharge date: 08/19/2024  Time spent: 35 minutes  Recommendations for Outpatient Follow-up:  Pcp f/u next week if possible F/u results of pelvic u/s  Consider 50-month f/u of incidental pancreatic cystic lesion (mri or ct)    Discharge Diagnoses:  Principal Problem:   Hypothermia Active Problems:   Mixed hyperlipidemia   Acquired hypothyroidism   Prediabetes   Discharge Condition: improved  Diet recommendation: heart healthy  Filed Weights   08/18/24 0424  Weight: 67.1 kg    History of present illness:  From admission h and p Kelsey Pacheco is a pleasant 83 y.o. female with medical history significant for hypothyroidism and hyperlipidemia, prediabetes and osteoporosis who presented to ED complaining of abdominal pain for the last 2 days.  Patient stated that the pain is in the right lower quadrant, feeling like ache, associate with some sweating.  She could not quantify the intensity of the pain.  She felt lightheaded and felt weak and then she started feeling better.  She came into the emergency room for evaluation.  She never had this kind of problems before.   Hospital Course:  Patient presents with lower abdominal discomfort and an episode of sweating. Found to be hypothermic with low lactic acid. No other focal symptoms and symptoms resolved promptly with fluids. Labs unremarkable, no uti symptoms and ua equivocal, blood cultures no growth thus far. Was started on ceftriaxone  and fluids. On hospital day one patient reports some generalized fullness in her lower abdomen. CT shows incidental pancreatic cystic lesion, 24 mo f/u advised. Also showed cystic struction posterior to the uterus. Given patient's ongoing lower abdominal symptoms, pelvic u/s was obtained to further evaluate. This was completed around 1 pm, but as of 5:30 images haven't been sent to  radiology to read. Patient feels well and requests discharge, is aware pelvic u/s results could change management, she requests discharge, plan made to contact patient if any finding that requires f/u, she will also f/u with her pcp this coming week. No clear explanation for patient's presenting symptoms. Urine culture was added on, though note this add-on will take place more than 24 hours after the urine was obtained. We will also continue to monitor blood cultures.   Procedures: none   Consultations: none  Discharge Exam: Vitals:   08/19/24 0715 08/19/24 1544  BP: (!) 155/68 (!) 145/71  Pulse: 88 92  Resp: 15 18  Temp: 98.5 F (36.9 C) 98 F (36.7 C)  SpO2: 100% 100%    General: NAD Cardiovascular: RRR Respiratory: CTAB Abdomen: soft, non-tender, non-distended  Discharge Instructions   Discharge Instructions     Diet - low sodium heart healthy   Complete by: As directed    Increase activity slowly   Complete by: As directed       Allergies as of 08/19/2024       Reactions   Fosamax  [alendronate ] Other (See Comments)   Body aches        Medication List     TAKE these medications    atorvastatin  40 MG tablet Commonly known as: LIPITOR Take 1 tablet (40 mg total) by mouth daily.   CALCIUM  600 PO Take 1 tablet by mouth daily.   ferrous sulfate 325 (65 FE) MG EC tablet Take 325 mg by mouth 3 (three) times daily with meals.   levothyroxine  100 MCG tablet Commonly known as: SYNTHROID  Take 1 tablet (  100 mcg total) by mouth daily before breakfast.   LYSINE PO Take by mouth as needed.   MULTIPLE VITAMIN PO Take 1 tablet by mouth daily.   vitamin C 100 MG tablet Take 1 tablet by mouth daily.   Vitamin D  (Cholecalciferol) 25 MCG (1000 UT) Tabs Take 1 tablet by mouth daily.   Vitamin E 100 units Tabs Take 1 capsule by mouth daily.       Allergies  Allergen Reactions   Fosamax  [Alendronate ] Other (See Comments)    Body aches    Follow-up  Information     [name], MD Follow up.   Specialty: Internal Medicine Contact information: [address] [place] [address] [phone]                  The results of significant diagnostics from this hospitalization (including imaging, microbiology, ancillary and laboratory) are listed below for reference.    Significant Diagnostic Studies: CT ABDOMEN PELVIS W CONTRAST Result Date: 08/18/2024 EXAM: CT ABDOMEN AND PELVIS WITH CONTRAST 08/18/2024 05:40:45 AM TECHNIQUE: CT of the abdomen and pelvis was performed with the administration of 100 mL of iohexol  (OMNIPAQUE ) 300 MG/ML solution. Multiplanar reformatted images are provided for review. Automated exposure control, iterative reconstruction, and/or weight-based adjustment of the mA/kV was utilized to reduce the radiation dose to as low as reasonably achievable. COMPARISON: None available. CLINICAL HISTORY: Abdominal pain, acute, nonlocalized. Patient C/O lower abdominal pain, bloating and weakness that began on Wednesday. FINDINGS: LOWER CHEST: There is mild chronic postinflammatory changes within the lung bases with parenchymal banding and interstitial reticulation. No acute findings. LIVER: There are 2 small low-density foci within the liver measuring up to 6 mm. These are too small to characterize. No suspicious liver lesion. GALLBLADDER AND BILE DUCTS: Gallstones. No gallbladder wall thickening or pericholecystic inflammation. Common bile duct measures up to 7 mm. No obstructing stone or mass identified. No intrahepatic bile duct dilatation. SPLEEN: No acute abnormality. PANCREAS: Cystic lesion containing an internal septation without enhancing mural nodule is identified within the head and uncinate process of pancreas measuring 2 x 1.0 cm, image 32/2. No pancreatic main duct dilatation, inflammation or mass. ADRENAL GLANDS: No acute abnormality. KIDNEYS, URETERS AND BLADDER: No stones in the kidneys or  ureters. No hydronephrosis. No perinephric or periureteral stranding. Urinary bladder is unremarkable. GI AND BOWEL: Moderate-sized hiatal hernia. No pathologic dilatation of the large or small bowel loops. The cecum and ileocecal valve are in the left hemiabdomen. The appendix is identified left of the midline and appears normal in caliber without periappendiceal soft tissue stranding. No signs of bowel inflammation. Scattered colonic and diverticula noted without signs of acute diverticulitis. Stomach demonstrates no acute abnormality. There is no bowel obstruction. PERITONEUM AND RETROPERITONEUM: No free fluid or fluid collections identified within the abdomen or pelvis. No signs of pneumoperitoneum. No ascites. No free air. VASCULATURE: Aorta is normal in caliber. Aortic atherosclerosis. Patent abdominal vascularity. LYMPH NODES: No lymphadenopathy. 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 Posterior to the uterus is a cystic structure which measures 5.8 x 4.8 x 5.9 cm. This is simple-appearing but not adequately characterized and may be a peritoneal inclusion cyst or adnexal cyst. Follow-up imaging with pelvic sonogram is advised. 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 No acute osseous abnormality. No focal soft tissue abnormality. IMPRESSION: 1. No acute findings in the  abdomen or pelvis. 2. Gallstones without cholecystitis. 3. 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 According to consensus criteria, follow-up imaging in 24 months with pancreas protocol MRI or CT is advised. 4. Moderate-sized hiatal hernia. 5. Cystic structure posterior to the uterus. Simple-appearing but not adequately characterized. Recommend follow-up with pelvic sonogram.  Electronically signed by: Waddell Calk MD 08/18/2024 06:27 AM EDT RP Workstation: [id number]    Microbiology: Recent Results (from the past 240 hours)  Resp panel by RT-PCR (RSV, Flu A&B, Covid) Anterior Nasal Swab     Status: None   Collection Time: 08/18/24  8:31 AM   Specimen: Anterior Nasal Swab  Result Value Ref Range Status   SARS Coronavirus 2 by RT PCR NEGATIVE NEGATIVE Final    Comment: (NOTE) SARS-CoV-2 target nucleic acids are NOT DETECTED.  The SARS-CoV-2 RNA is generally detectable in upper respiratory specimens during the acute phase of infection. The lowest concentration of SARS-CoV-2 viral copies this assay can detect is 138 copies/mL. A negative result does not preclude SARS-Cov-2 infection and should not be used as the sole basis for treatment or other patient management decisions. A negative result may occur with  improper specimen collection/handling, submission of specimen other than nasopharyngeal swab, presence of viral mutation(s) within the areas targeted by this assay, and inadequate number of viral copies(<138 copies/mL). A negative result must be combined with clinical observations, patient history, and epidemiological information. The expected result is Negative.  Fact Sheet for Patients:  BloggerCourse.com  Fact Sheet for Healthcare Providers:  SeriousBroker.it  This test is no t yet approved or cleared by the [place]  FDA and  has been authorized for detection and/or diagnosis of SARS-CoV-2 by FDA under an Emergency Use Authorization (EUA). This EUA will remain  in effect (meaning this test can be used) for the duration of the COVID-19 declaration under Section 564(b)(1) of the Act, 21 U.S.C.section 360bbb-3(b)(1), unless the authorization is terminated  or revoked sooner.       Influenza A by PCR NEGATIVE NEGATIVE Final   Influenza B by PCR NEGATIVE NEGATIVE Final    Comment: (NOTE) The  Xpert Xpress SARS-CoV-2/FLU/RSV plus assay is intended as an aid in the diagnosis of influenza from Nasopharyngeal swab specimens and should not be used as a sole basis for treatment. Nasal washings and aspirates are unacceptable for Xpert Xpress SARS-CoV-2/FLU/RSV testing.  Fact Sheet for Patients: BloggerCourse.com  Fact Sheet for Healthcare Providers: SeriousBroker.it  This test is not yet approved or cleared by the [place]  FDA and has been authorized for detection and/or diagnosis of SARS-CoV-2 by FDA under an Emergency Use Authorization (EUA). This EUA will remain in effect (meaning this test can be used) for the duration of the COVID-19 declaration under Section 564(b)(1) of the Act, 21 U.S.C. section 360bbb-3(b)(1), unless the authorization is terminated or revoked.     Resp Syncytial Virus by PCR NEGATIVE NEGATIVE Final    Comment: (NOTE) Fact Sheet for Patients: BloggerCourse.com  Fact Sheet for Healthcare Providers: SeriousBroker.it  This test is not yet approved or cleared by the [place]  FDA and has been authorized for detection and/or diagnosis of SARS-CoV-2 by FDA under an Emergency Use Authorization (EUA). This EUA will remain in effect (meaning this test can be used) for the duration of the COVID-19 declaration under Section 564(b)(1) of the Act, 21 U.S.C. section 360bbb-3(b)(1), unless the authorization is terminated or revoked.  Performed at [facility], [address] [place], [place] [address]   Culture, blood (Routine X 2) w Reflex to ID Panel     Status: None (Preliminary result)   Collection Time: 08/18/24 10:33 AM   Specimen: BLOOD  Result Value Ref Range Status   Specimen Description BLOOD RIGHT ANTECUBITAL  Final   Special Requests   Final    BOTTLES DRAWN AEROBIC AND ANAEROBIC Blood Culture adequate volume   Culture   Final     NO GROWTH < 24 HOURS Performed at [facility], [address] [place], [place] [address]    Report Status PENDING  Incomplete  Culture, blood (Routine X 2) w Reflex to ID Panel     Status: None (Preliminary result)   Collection Time: 08/18/24 10:33 AM   Specimen: BLOOD  Result Value Ref Range Status   Specimen Description BLOOD LEFT ANTECUBITAL  Final   Special Requests   Final    BOTTLES DRAWN AEROBIC AND ANAEROBIC Blood Culture adequate volume   Culture   Final    NO GROWTH < 24 HOURS Performed at [facility], [address] [place], [place] [address]    Report Status PENDING  Incomplete     Labs: Basic Metabolic Panel: Recent Labs  Lab 08/18/24 0422 08/19/24 0437  NA 142 139  K 3.2* 3.7  CL 104 104  CO2 23 25  GLUCOSE 177* 122*  BUN 14 12  CREATININE 0.69 0.74  CALCIUM  9.0 8.9   Liver Function Tests: Recent Labs  Lab 08/18/24 0422 08/19/24 0437  AST 32 23  ALT 24 20  ALKPHOS 83 73  BILITOT 1.0 0.8  PROT 7.3 6.7  ALBUMIN 4.3 3.9   Recent Labs  Lab 08/18/24 0422  LIPASE 10*   No results for input(s): AMMONIA in the last 168 hours. CBC: Recent Labs  Lab 08/18/24 0422 08/19/24 0437  WBC 6.4 7.2  NEUTROABS 4.3  --   HGB 15.0 13.2  HCT 45.2 38.4  MCV 92.8 89.9  PLT 206 218   Cardiac Enzymes: No results for input(s): CKTOTAL, CKMB, CKMBINDEX, TROPONINI in the last 168 hours. BNP: BNP (last 3 results) No results for input(s): BNP in the last 8760 hours.  ProBNP (last 3 results) No results for input(s): PROBNP in the last 8760 hours.  CBG: No results for input(s): GLUCAP in the last 168 hours.     Signed:  Devaughn KATHEE Ban MD.  [facility] 08/19/2024, 5:28 PM

## 2024-08-20 ENCOUNTER — Telehealth: Payer: Self-pay | Admitting: Obstetrics and Gynecology

## 2024-08-20 DIAGNOSIS — N858 Other specified noninflammatory disorders of uterus: Secondary | ICD-10-CM

## 2024-08-20 NOTE — Telephone Encounter (Signed)
 Called patient to share u/s results, shows cystic structure posterior to uterus, not well characterized, radiologist advises mri to further evaluate. Patient unsure she wants to pursue that, wishes to discuss with her pcp first. Shared decision to go ahead and order to get that process started, though can decline to schedule the imaging if she chooses.

## 2024-08-21 ENCOUNTER — Telehealth: Payer: Self-pay

## 2024-08-21 ENCOUNTER — Ambulatory Visit: Payer: Self-pay | Admitting: Internal Medicine

## 2024-08-21 DIAGNOSIS — N3000 Acute cystitis without hematuria: Secondary | ICD-10-CM

## 2024-08-21 LAB — URINE CULTURE: Culture: 20000 — AB

## 2024-08-21 MED ORDER — NITROFURANTOIN MONOHYD MACRO 100 MG PO CAPS: 100.0000 mg | ORAL_CAPSULE | Freq: Two times a day (BID) | ORAL | 0 refills | Status: AC

## 2024-08-21 NOTE — Transitions of Care (Post Inpatient/ED Visit) (Signed)
   08/21/2024  Name: Kelsey Pacheco MRN: [id number] DOB: September 21, 1941  Today's TOC FU Call Status: Today's TOC FU Call Status:: Unsuccessful Call (1st Attempt) Unsuccessful Call (1st Attempt) Date: 08/21/24  Attempted to reach the patient regarding the most recent Inpatient/ED visit.  Follow Up Plan: Additional outreach attempts will be made to reach the patient to complete the Transitions of Care (Post Inpatient/ED visit) call.   Signature Julian Lemmings, LPN [facility] Nurse Health Advisor Direct Dial [phone]

## 2024-08-22 ENCOUNTER — Telehealth: Payer: Self-pay

## 2024-08-22 ENCOUNTER — Ambulatory Visit: Admitting: Internal Medicine

## 2024-08-22 ENCOUNTER — Encounter: Payer: Self-pay | Admitting: Internal Medicine

## 2024-08-22 VITALS — BP 120/72 | HR 89 | Ht 67.01 in | Wt 146.0 lb

## 2024-08-22 DIAGNOSIS — K869 Disease of pancreas, unspecified: Secondary | ICD-10-CM | POA: Diagnosis not present

## 2024-08-22 DIAGNOSIS — Z23 Encounter for immunization: Secondary | ICD-10-CM | POA: Diagnosis not present

## 2024-08-22 DIAGNOSIS — N3000 Acute cystitis without hematuria: Secondary | ICD-10-CM

## 2024-08-22 DIAGNOSIS — R1900 Intra-abdominal and pelvic swelling, mass and lump, unspecified site: Secondary | ICD-10-CM | POA: Diagnosis not present

## 2024-08-22 NOTE — Telephone Encounter (Signed)
 MRI was ordered by hospital and patient needs PA and to be scheduled for this.   Thank you!

## 2024-08-22 NOTE — Assessment & Plan Note (Addendum)
 Cystic mass without worrisome features noted.  No abdominal or digestive complaints. Will plan for follow up MRI in 24 months unless symptoms occur.

## 2024-08-22 NOTE — Assessment & Plan Note (Addendum)
 Will get MRI with contrast of pelvis approved and scheduled.

## 2024-08-22 NOTE — Progress Notes (Signed)
 Date:  08/22/2024   Name:  Kelsey Pacheco   DOB:  May 03, 1941   MRN:  [id number]   Chief Complaint: Hospitalization Follow-up Hospital follow up.   Admitted to [facility]  9/26 to 08/19/24. TOC call done 9/29. HPI Hospital Course:  Patient presents with lower abdominal discomfort and an episode of sweating. Found to be hypothermic with low lactic acid. No other focal symptoms and symptoms resolved promptly with fluids. Labs unremarkable, no uti symptoms and ua equivocal, blood cultures no growth thus far. Was started on ceftriaxone  and fluids. On hospital day one patient reports some generalized fullness in her lower abdomen. CT shows incidental pancreatic cystic lesion, 24 mo f/u advised. Also showed cystic struction posterior to the uterus. Given patient's ongoing lower abdominal symptoms, pelvic u/s was obtained to further evaluate. This was completed around 1 pm, but as of 5:30 images haven't been sent to radiology to read. Patient feels well and requests discharge, is aware pelvic u/s results could change management, she requests discharge, plan made to contact patient if any finding that requires f/u, she will also f/u with her pcp this coming week. No clear explanation for patient's presenting symptoms. Urine culture was added on, though note this add-on will take place more than 24 hours after the urine was obtained. We will also continue to monitor blood cultures.   Medication List       TAKE these medications     atorvastatin  40 MG tablet Commonly known as: LIPITOR Take 1 tablet (40 mg total) by mouth daily.    CALCIUM  600 PO Take 1 tablet by mouth daily.    ferrous sulfate 325 (65 FE) MG EC tablet Take 325 mg by mouth 3 (three) times daily with meals.    levothyroxine  100 MCG tablet Commonly known as: SYNTHROID  Take 1 tablet (100 mcg total) by mouth daily before breakfast.    LYSINE PO Take by mouth as needed.    MULTIPLE VITAMIN PO Take 1 tablet by mouth daily.    vitamin  C 100 MG tablet Take 1 tablet by mouth daily.    Vitamin D  (Cholecalciferol) 25 MCG (1000 UT) Tabs Take 1 tablet by mouth daily.    Vitamin E 100 units Tabs Take 1 capsule by mouth daily.   Pelvic US :  IMPRESSION: 1. Cystic mass posterior to the uterus in the pelvis on the right, not wall evaluated on this exam. MRI with contrast is recommended for further characterization. 2. Uterine fibroids. 3. Ovaries are not seen.  CT ABD/Pelvis: IMPRESSION: 1. No acute findings in the abdomen or pelvis. 2. Gallstones without cholecystitis. 3. 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 According to consensus criteria, follow-up imaging in 24 months with pancreas protocol MRI or CT is advised. 4. Moderate-sized hiatal hernia. 5. Cystic structure posterior to the uterus. Simple-appearing but not adequately characterized. Recommend follow-up with pelvic sonogram.  Review of Systems  Constitutional:  Negative for diaphoresis, fatigue, fever and unexpected weight change.  HENT:  Negative for trouble swallowing.   Eyes:  Negative for visual disturbance.  Respiratory:  Negative for cough, chest tightness, shortness of breath and wheezing.   Cardiovascular:  Negative for chest pain, palpitations and leg swelling.  Gastrointestinal:  Negative for abdominal pain, constipation and diarrhea.  Genitourinary:  Negative for dysuria, frequency and urgency.  Musculoskeletal:  Negative for arthralgias and myalgias.  Neurological:  Negative for dizziness, weakness, light-headedness and  headaches.  Psychiatric/Behavioral:  Negative for sleep disturbance. The patient is not nervous/anxious.      Lab Results  Component Value Date   NA 139 08/19/2024   K 3.7 08/19/2024   CO2 25 08/19/2024   GLUCOSE 122 (H) 08/19/2024   BUN 12 08/19/2024   CREATININE 0.74 08/19/2024   CALCIUM  8.9 08/19/2024   EGFR 70  11/15/2023   GFRNONAA >60 08/19/2024   Lab Results  Component Value Date   CHOL 191 11/15/2023   HDL 98 11/15/2023   LDLCALC 72 11/15/2023   TRIG 124 11/15/2023   CHOLHDL 1.9 11/15/2023   Lab Results  Component Value Date   TSH 3.557 08/18/2024   Lab Results  Component Value Date   HGBA1C 6.9 (H) 11/15/2023   Lab Results  Component Value Date   WBC 7.2 08/19/2024   HGB 13.2 08/19/2024   HCT 38.4 08/19/2024   MCV 89.9 08/19/2024   PLT 218 08/19/2024   Lab Results  Component Value Date   ALT 20 08/19/2024   AST 23 08/19/2024   ALKPHOS 73 08/19/2024   BILITOT 0.8 08/19/2024   Lab Results  Component Value Date   VD25OH 29.0 (L) 11/06/2021     Patient Active Problem List   Diagnosis Date Noted   Pancreatic lesion 08/22/2024   Pelvic mass in female 08/22/2024   Hypothermia 08/18/2024   Abrasion 06/29/2024   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 Renita   BREAST BIOPSY Right 2006   bx/clip-neg   BREAST BIOPSY Right 08/19/2022   Right Breast stereo BX, coil clip, DENSE STROMAL FIBROSIS - NEGATIVE FOR ATYPIA   CATARACT EXTRACTION W/PHACO Right 03/16/2018   Procedure: CATARACT EXTRACTION PHACO AND INTRAOCULAR LENS PLACEMENT (IOC) RIGHT;  Surgeon: Mittie Gaskin, MD;  Location: [facility];  Service: Ophthalmology;  Laterality: Right;  requests early   CATARACT EXTRACTION W/PHACO Left 05/19/2023   Procedure:  CATARACT EXTRACTION PHACO AND INTRAOCULAR LENS PLACEMENT (IOC) LEFT 10.12 00:55.9;  Surgeon: Mittie Gaskin, MD;  Location: [facility];  Service: Ophthalmology;  Laterality: Left;   Cologuard home test  12/2015   Negative   RETINAL DETACHMENT SURGERY Right 2018    Social History   Tobacco Use   Smoking status: Never   Smokeless tobacco: Never  Vaping Use   Vaping status: Never Used  Substance Use Topics   Alcohol use: No   Drug use: No     Medication list has been reviewed and updated.  Current Meds  Medication Sig   Ascorbic Acid (VITAMIN C) 100 MG tablet Take 1 tablet by mouth daily.   atorvastatin  (LIPITOR) 40 MG tablet Take 1 tablet (40 mg total) by mouth daily.   Calcium  Carbonate (CALCIUM  600 PO) Take 1 tablet by mouth daily.   ferrous sulfate 325 (65 FE) MG EC tablet Take 325 mg by mouth 3 (three) times daily with meals.   levothyroxine  (SYNTHROID ) 100 MCG tablet Take 1 tablet (100 mcg  total) by mouth daily before breakfast.   LYSINE PO Take by mouth as needed.   MULTIPLE VITAMIN PO Take 1 tablet by mouth daily.   nitrofurantoin, macrocrystal-monohydrate, (MACROBID) 100 MG capsule Take 1 capsule (100 mg total) by mouth 2 (two) times daily for 7 days.   Vitamin D , Cholecalciferol, 1000 UNITS TABS Take 1 tablet by mouth daily.   Vitamin E 100 UNITS TABS Take 1 capsule by mouth daily.       08/22/2024    9:05 AM 11/15/2023    7:58 AM 11/10/2022    8:00 AM 11/06/2021    8:14 AM  GAD 7 : Generalized Anxiety Score  Nervous, Anxious, on Edge 0 0 1 1  Control/stop worrying 0 1 0 0  Worry too much - different things 0 0 1 0  Trouble relaxing 0 0 1 0  Restless 0 0 1 0  Easily annoyed or irritable 0 0 1 0  Afraid - awful might happen 0 0 0 0  Total GAD 7 Score 0 1 5 1   Anxiety Difficulty Not difficult at all Not difficult at all Not difficult at all Not difficult at all       08/22/2024    9:05 AM 11/15/2023    7:58 AM 04/14/2023    9:37 AM   Depression screen PHQ 2/9  Decreased Interest 0 1 0  Down, Depressed, Hopeless 0 1 1  PHQ - 2 Score 0 2 1  Altered sleeping 0 1 0  Tired, decreased energy 0 0 0  Change in appetite 0 0 0  Feeling bad or failure about yourself  0 0 0  Trouble concentrating 0 0 0  Moving slowly or fidgety/restless 0 0 0  Suicidal thoughts 0 0 0  PHQ-9 Score 0 3 1  Difficult doing work/chores Not difficult at all Not difficult at all Not difficult at all    BP Readings from Last 3 Encounters:  08/22/24 120/72  08/19/24 (!) 145/71  06/29/24 (!) 161/93    Physical Exam Vitals and nursing note reviewed.  Constitutional:      General: She is not in acute distress.    Appearance: Normal appearance. She is well-developed.  HENT:     Head: Normocephalic and atraumatic.  Cardiovascular:     Rate and Rhythm: Normal rate and regular rhythm.  Pulmonary:     Effort: Pulmonary effort is normal. No respiratory distress.     Breath sounds: No wheezing or rhonchi.  Abdominal:     General: Abdomen is flat.     Palpations: Abdomen is soft. There is no mass.     Tenderness: There is no abdominal tenderness.  Musculoskeletal:     Cervical back: Normal range of motion.     Right lower leg: No edema.     Left lower leg: No edema.  Lymphadenopathy:     Cervical: No cervical adenopathy.  Skin:    General: Skin is warm and dry.     Capillary Refill: Capillary refill takes less than 2 seconds.     Findings: No rash.  Neurological:     General: No focal deficit present.     Mental Status: She is alert and oriented to person, place, and time.  Psychiatric:        Mood and Affect: Mood normal.        Behavior: Behavior normal.     Wt Readings from Last 3 Encounters:  08/22/24 146 lb (66.2 kg)  08/18/24 148 lb (67.1  kg)  11/15/23 148 lb 6.4 oz (67.3 kg)    BP 120/72   Pulse 89   Ht 5' 7.01 (1.702 m)   Wt 146 lb (66.2 kg)   SpO2 95%   BMI 22.86 kg/m   Assessment and Plan:  Problem List Items  Addressed This Visit       Unprioritized   Pancreatic lesion (Chronic)   Cystic mass without worrisome features noted.  No abdominal or digestive complaints. Will plan for follow up MRI in 24 months unless symptoms occur.      Pelvic mass in female   Will get MRI with contrast of pelvis approved and scheduled.      Other Visit Diagnoses       Acute cystitis without hematuria    -  Primary   positive staph culture from admission started on Macrobid yesterday     Encounter for immunization       Relevant Orders   Flu vaccine HIGH DOSE PF(Fluzone Trivalent) (Completed)       No follow-ups on file.    Leita HILARIO Adie, MD [facility] [place]

## 2024-08-23 LAB — CULTURE, BLOOD (ROUTINE X 2)
Culture: NO GROWTH
Culture: NO GROWTH
Special Requests: ADEQUATE
Special Requests: ADEQUATE

## 2024-08-29 ENCOUNTER — Ambulatory Visit
Admission: RE | Admit: 2024-08-29 | Discharge: 2024-08-29 | Disposition: A | Discharge status: Home / Self Care | Source: Ambulatory Visit | Attending: Obstetrics and Gynecology | Admitting: Obstetrics and Gynecology

## 2024-08-29 DIAGNOSIS — N858 Other specified noninflammatory disorders of uterus: Secondary | ICD-10-CM | POA: Insufficient documentation

## 2024-08-29 MED ORDER — GADOBUTROL 1 MMOL/ML IV SOLN
6.0000 mL | Freq: Once | INTRAVENOUS | Status: AC | PRN
  Administered 2024-08-29: 6 mL via INTRAVENOUS

## 2024-08-30 NOTE — Transitions of Care (Post Inpatient/ED Visit) (Signed)
   08/30/2024  Name: Kelsey Pacheco MRN: [id number] DOB: 1941/04/19  Today's TOC FU Call Status: Today's TOC FU Call Status:: Unsuccessful Call (1st Attempt) Unsuccessful Call (1st Attempt) Date: 08/21/24  Attempted to reach the patient regarding the most recent Inpatient/ED visit.  Follow Up Plan: No further outreach attempts will be made at this time. We have been unable to contact the patient. Patient already seen Signature Julian Lemmings, LPN [facility] Nurse Health Advisor Direct Dial [phone]

## 2024-09-10 ENCOUNTER — Other Ambulatory Visit: Payer: Self-pay | Admitting: Medical Genetics

## 2024-10-16 ENCOUNTER — Telehealth: Payer: Self-pay | Admitting: Internal Medicine

## 2024-10-16 NOTE — Telephone Encounter (Signed)
 Copied from CRM #[id number]. Topic: Medicare AWV >> Oct 16, 2024  9:37 AM Nathanel DEL wrote: Called LVM 10/16/2024 to sched AWV. Please schedule in office or virtual visit.   Nathanel Paschal; Care Guide Ambulatory Clinical Support [facility] Direct Dial: [phone]

## 2024-10-25 ENCOUNTER — Ambulatory Visit

## 2024-10-25 DIAGNOSIS — Z0000 Encounter for general adult medical examination without abnormal findings: Secondary | ICD-10-CM | POA: Diagnosis not present

## 2024-10-25 DIAGNOSIS — Z780 Asymptomatic menopausal state: Secondary | ICD-10-CM

## 2024-10-25 DIAGNOSIS — Z1231 Encounter for screening mammogram for malignant neoplasm of breast: Secondary | ICD-10-CM

## 2024-10-25 NOTE — Progress Notes (Signed)
 I connected with  [name] Clause on 10/25/24 by a audio enabled telemedicine application and verified that I am speaking with the correct person using two identifiers.  Patient Location: Home  Provider Location: Office/Clinic  Persons Participating in Visit: Patient.  I discussed the limitations of evaluation and management by telemedicine. The patient expressed understanding and agreed to proceed.   Vital Signs: Because this visit was a virtual/telehealth visit, some criteria may be missing or patient reported. Any vitals not documented were not able to be obtained and vitals that have been documented are patient reported.     No chief complaint on file.    Subjective:   Kelsey Pacheco is a 83 y.o. female who presents for a Medicare Annual Wellness Visit.  Fall Screening Falls in the past year?: 0 Number of falls in past year: 0 Was there an injury with Fall?: 0 Fall Risk Category Calculator: 0 Patient Fall Risk Level: Low Fall Risk  Fall Risk Patient at Risk for Falls Due to: No Fall Risks Fall risk Follow up: Falls evaluation completed  Advance Directives (For Healthcare) Does Patient Have a Medical Advance Directive?: Yes Does patient want to make changes to medical advance directive?: Yes (Inpatient - patient defers changing a medical advance directive and declines information at this time) Type of Advance Directive: Healthcare Power of Attorney Copy of Healthcare Power of Attorney in Chart?: Yes - validated most recent copy scanned in chart (See row information) Would patient like information on creating a medical advance directive?: No - Patient declined    Allergies (verified) Fosamax  [alendronate ]   Current Medications (verified) Outpatient Encounter Medications as of 10/25/2024  Medication Sig   Ascorbic Acid (VITAMIN C) 100 MG tablet Take 1 tablet by mouth daily.   atorvastatin  (LIPITOR) 40 MG tablet Take 1 tablet (40 mg total) by mouth daily.    Calcium  Carbonate (CALCIUM  600 PO) Take 1 tablet by mouth daily.   ferrous sulfate 325 (65 FE) MG EC tablet Take 325 mg by mouth 3 (three) times daily with meals.   levothyroxine  (SYNTHROID ) 100 MCG tablet Take 1 tablet (100 mcg total) by mouth daily before breakfast.   LYSINE PO Take by mouth as needed.   MULTIPLE VITAMIN PO Take 1 tablet by mouth daily.   Vitamin D , Cholecalciferol, 1000 UNITS TABS Take 1 tablet by mouth daily.   Vitamin E 100 UNITS TABS Take 1 capsule by mouth daily.   No facility-administered encounter medications on file as of 10/25/2024.    History: Past Medical History:  Diagnosis Date   Arthritis    lower back   Hyperlipidemia    Hypothyroidism    Osteoporosis    Sciatica of left side    Thyroid  disease    Vitamin D  deficiency    Past Surgical History:  Procedure Laterality Date   BASAL CELL CARCINOMA EXCISION  06/04/2011   [facility] (Mohs procedure) left orbital Dr. Renita   BREAST BIOPSY Right 2006   bx/clip-neg   BREAST BIOPSY Right 08/19/2022   Right Breast stereo BX, coil clip, DENSE STROMAL FIBROSIS - NEGATIVE FOR ATYPIA   CATARACT EXTRACTION W/PHACO Right 03/16/2018   Procedure: CATARACT EXTRACTION PHACO AND INTRAOCULAR LENS PLACEMENT (IOC) RIGHT;  Surgeon: Mittie Gaskin, MD;  [facility];  Service: Ophthalmology;  Laterality: Right;  requests early   CATARACT EXTRACTION W/PHACO Left 05/19/2023   Procedure: CATARACT EXTRACTION PHACO AND INTRAOCULAR LENS PLACEMENT (IOC) LEFT 10.12 00:55.9;  Surgeon: Mittie Gaskin, MD;  Location:  [facility];  Service: Ophthalmology;  Laterality: Left;   Cologuard home test  12/2015   Negative   RETINAL DETACHMENT SURGERY Right 2018   Family History  Problem Relation Age of Onset   Dementia Father    Heart disease Father    Heart attack Mother    Breast cancer Neg Hx    Social History   Occupational History    Comment: RETIRED  Tobacco Use   Smoking status: Never    Smokeless tobacco: Never  Vaping Use   Vaping status: Never Used  Substance and Sexual Activity   Alcohol use: No   Drug use: No   Sexual activity: Never   Tobacco Counseling Counseling given: Not Answered  SDOH Screenings   Food Insecurity: No Food Insecurity (08/18/2024)  Housing: Low Risk  (08/18/2024)  Transportation Needs: No Transportation Needs (08/18/2024)  Utilities: Not At Risk (08/18/2024)  Alcohol Screen: Low Risk  (04/14/2023)  Depression (PHQ2-9): Low Risk  (08/22/2024)  Financial Resource Strain: Low Risk  (04/14/2023)  Physical Activity: Insufficiently Active (04/14/2023)  Social Connections: Socially Isolated (08/18/2024)  Stress: No Stress Concern Present (04/14/2023)  Tobacco Use: Low Risk  (08/22/2024)   See flowsheets for full screening details  Depression Screen PHQ 2 & 9 Depression Scale- Over the past 2 weeks, how often have you been bothered by any of the following problems? Little interest or pleasure in doing things: 0 Feeling down, depressed, or hopeless (PHQ Adolescent also includes...irritable): 0 PHQ-2 Total Score: 0 Trouble falling or staying asleep, or sleeping too much: 0 Feeling tired or having little energy: 0 Poor appetite or overeating (PHQ Adolescent also includes...weight loss): 0 Feeling bad about yourself - or that you are a failure or have let yourself or your family down: 0 Trouble concentrating on things, such as reading the newspaper or watching television (PHQ Adolescent also includes...like school work): 0 Moving or speaking so slowly that other people could have noticed. Or the opposite - being so fidgety or restless that you have been moving around a lot more than usual: 0 Thoughts that you would be better off dead, or of hurting yourself in some way: 0 PHQ-9 Total Score: 0 If you checked off any problems, how difficult have these problems made it for you to do your work, take care of things at home, or get along with other people?: Not  difficult at all     Goals Addressed   None          Objective:    There were no vitals filed for this visit. There is no height or weight on file to calculate BMI.  Hearing/Vision screen No results found. Immunizations and Health Maintenance Health Maintenance  Topic Date Due   Zoster Vaccines- Shingrix (1 of 2) 02/26/1991   DTaP/Tdap/Td (4 - Td or Tdap) 08/28/2021   Medicare Annual Wellness (AWV)  04/13/2024   COVID-19 Vaccine (5 - 2025-26 season) 07/24/2024   Bone Density Scan  01/16/2025   Mammogram  02/16/2025   Pneumococcal Vaccine: 50+ Years  Completed   Influenza Vaccine  Completed   Meningococcal B Vaccine  Aged Out   Hepatitis C Screening  Discontinued        Assessment/Plan:  This is a routine wellness examination for [facility].  Patient Care Team: [name], MD as PCP - General (Internal Medicine)  I have personally reviewed and noted the following in the patient's chart:   Medical and social history Use of alcohol, tobacco or  illicit drugs  Current medications and supplements including opioid prescriptions. Functional ability and status Nutritional status Physical activity Advanced directives List of other physicians Hospitalizations, surgeries, and ER visits in previous 12 months Vitals Screenings to include cognitive, depression, and falls Referrals and appointments  No orders of the defined types were placed in this encounter.  In addition, I have reviewed and discussed with patient certain preventive protocols, quality metrics, and best practice recommendations. A written personalized care plan for preventive services as well as general preventive health recommendations were provided to patient.   Jhonnie GORMAN Das, LPN   [id number]   No follow-ups on file.  After Visit Summary: (MyChart) Due to this being a telephonic visit, the after visit summary with patients personalized plan was offered to patient via MyChart   Nurse Notes:  NEEDS PNA, TDAP, SHINGRIX; REFERRALS MADE FOR BDS & MAMMOGRAM; AGED OUT OF COLONOSCOPY

## 2024-10-25 NOTE — Patient Instructions (Addendum)
 Kelsey Pacheco,  Thank you for taking the time for your Medicare Wellness Visit. I appreciate your continued commitment to your health goals. Please review the care plan we discussed, and feel free to reach out if I can assist you further.  Please note that Annual Wellness Visits do not include a physical exam. Some assessments may be limited, especially if the visit was conducted virtually. If needed, we may recommend an in-person follow-up with your provider.  Ongoing Care Seeing your primary care provider every 3 to 6 months helps us  monitor your health and provide consistent, personalized care.   Referrals If a referral was made during today's visit and you haven't received any updates within two weeks, please contact the referred provider directly to check on the status.  REFERRALS SENT FOR BONE DENSITY SCAN & MAMMOGRAM- MAKE SURE TO SCHEDULE THESE ON THE SAME DAY & AFTER MARCH 27 You have an order for:  []   2D Mammogram  [x]   3D Mammogram  [x]   Bone Density     Please call for appointment:  [facility]  [address] [place] [address] [phone] [facility] [address] [place], [place] [address] [phone] [facility] [address] [place], [place] [address] [phone]   Make sure to wear two-piece clothing.  No lotions, powders, or deodorants the day of the appointment. Make sure to bring picture ID and insurance card.  Bring list of medications you are currently taking including any supplements.   Schedule your [place] screening mammogram through MyChart!   Log into your MyChart account.  Go to 'Visit' (or 'Appointments' if on mobile App) --> Schedule an Appointment  Under 'Select a Reason for Visit' choose the Mammogram Screening option.  Complete the pre-visit questions and select the time and place that best fits your  schedule.   Recommended Screenings:  Health Maintenance  Topic Date Due   Zoster (Shingles) Vaccine (1 of 2) 02/26/1991   DTaP/Tdap/Td vaccine (4 - Td or Tdap) 08/28/2021   COVID-19 Vaccine (5 - 2025-26 season) 07/24/2024   Osteoporosis screening with Bone Density Scan  01/16/2025   Breast Cancer Screening  02/16/2025   Medicare Annual Wellness Visit  10/25/2025   Pneumococcal Vaccine for age over 75  Completed   Flu Shot  Completed   Meningitis B Vaccine  Aged Out   Hepatitis C Screening  Discontinued       10/25/2024    3:23 PM  Advanced Directives  Does Patient Have a Medical Advance Directive? Yes  Type of Estate Agent of [place];Living will  Does patient want to make changes to medical advance directive? No - Patient declined  Copy of Healthcare Power of Attorney in Chart? Yes - validated most recent copy scanned in chart (See row information)    Vision: Annual vision screenings are recommended for early detection of glaucoma, cataracts, and diabetic retinopathy. These exams can also reveal signs of chronic conditions such as diabetes and high blood pressure.  Dental: Annual dental screenings help detect early signs of oral cancer, gum disease, and other conditions linked to overall health, including heart disease and diabetes.  Please see the attached documents for additional preventive care recommendations.    NEXT AWV 11/22/25 @ 10:00 AM IN PERSON

## 2024-11-20 ENCOUNTER — Encounter: Payer: Self-pay | Admitting: Internal Medicine

## 2024-11-20 ENCOUNTER — Ambulatory Visit (INDEPENDENT_AMBULATORY_CARE_PROVIDER_SITE_OTHER): Admitting: Internal Medicine

## 2024-11-20 VITALS — BP 122/74 | HR 99 | Ht 67.0 in | Wt 152.0 lb

## 2024-11-20 DIAGNOSIS — N9489 Other specified conditions associated with female genital organs and menstrual cycle: Secondary | ICD-10-CM

## 2024-11-20 DIAGNOSIS — Z1231 Encounter for screening mammogram for malignant neoplasm of breast: Secondary | ICD-10-CM

## 2024-11-20 DIAGNOSIS — Z0000 Encounter for general adult medical examination without abnormal findings: Secondary | ICD-10-CM

## 2024-11-20 DIAGNOSIS — E039 Hypothyroidism, unspecified: Secondary | ICD-10-CM | POA: Diagnosis not present

## 2024-11-20 DIAGNOSIS — E782 Mixed hyperlipidemia: Secondary | ICD-10-CM | POA: Diagnosis not present

## 2024-11-20 DIAGNOSIS — R7303 Prediabetes: Secondary | ICD-10-CM | POA: Diagnosis not present

## 2024-11-20 NOTE — Patient Instructions (Signed)
 Call [facility] to schedule your mammogram at [phone].   Tell them that your new PCP is Dr. Harlene Saddler.

## 2024-11-20 NOTE — Assessment & Plan Note (Signed)
 Supplemented. Lab Results  Component Value Date   TSH 3.557 08/18/2024

## 2024-11-20 NOTE — Assessment & Plan Note (Signed)
 On atorvastatin  with good response and no side effects. Lab Results  Component Value Date   LDLCALC 72 11/15/2023

## 2024-11-20 NOTE — Progress Notes (Signed)
 "   Date:  11/20/2024   Name:  Kelsey Pacheco   DOB:  10-Aug-1941   MRN:  [id number]   Chief Complaint: Annual Exam Kelsey Pacheco is a 83 y.o. female who presents today for her Complete Annual Exam. She feels fairly well. She reports exercising by walk her dog x 2 -3 times a day. She reports she is sleeping fairly well. Breast complaints none.  Health Maintenance  Topic Date Due   Zoster (Shingles) Vaccine (1 of 2) 02/26/1991   DTaP/Tdap/Td vaccine (4 - Td or Tdap) 08/28/2021   COVID-19 Vaccine (5 - 2025-26 season) 07/24/2024   Osteoporosis screening with Bone Density Scan  01/16/2025   Breast Cancer Screening  02/16/2025   Medicare Annual Wellness Visit  10/25/2025   Pneumococcal Vaccine for age over 78  Completed   Flu Shot  Completed   Meningitis B Vaccine  Aged Out   Hepatitis C Screening  Discontinued    Thyroid  Problem Presents for follow-up visit. Patient reports no anxiety, constipation, diarrhea, fatigue or palpitations. The symptoms have been stable. Her past medical history is significant for hyperlipidemia.  Hyperlipidemia This is a chronic problem. The problem is controlled. Pertinent negatives include no chest pain, myalgias or shortness of breath. Current antihyperlipidemic treatment includes statins.  Pelvic lesion-  08/29/24 MR: IMPRESSION: 1. There is a 5.0 x 6.1 cm cystic lesion in the cul de sac, which corresponds to the observations seen on the recent ultrasound. Lesion most likely arises from the right adnexa and does not exhibit abnormal contrast enhancement. This is indeterminate in etiology but favored to represent benign surface epithelial lesion such as a mucinous/serous cystadenoma or paraovarian/paratubal cysts, etc. Gynecological consultation, correlation with tumor markers and continued follow-up in 6-12 months is recommended to document stability. 2. Multiple intramural and subserosal leiomyomas.  Review of Systems  Constitutional:  Negative  for fatigue and unexpected weight change.  HENT:  Negative for trouble swallowing.   Eyes:  Negative for visual disturbance.  Respiratory:  Negative for cough, chest tightness, shortness of breath and wheezing.   Cardiovascular:  Negative for chest pain, palpitations and leg swelling.  Gastrointestinal:  Negative for abdominal distention, abdominal pain, constipation, diarrhea and vomiting.  Genitourinary:  Negative for frequency and urgency.  Musculoskeletal:  Negative for arthralgias and myalgias.  Neurological:  Negative for dizziness, weakness, light-headedness and headaches.  Psychiatric/Behavioral:  Negative for dysphoric mood and sleep disturbance. The patient is not nervous/anxious.      Lab Results  Component Value Date   NA 139 08/19/2024   K 3.7 08/19/2024   CO2 25 08/19/2024   GLUCOSE 122 (H) 08/19/2024   BUN 12 08/19/2024   CREATININE 0.74 08/19/2024   CALCIUM  8.9 08/19/2024   EGFR 70 11/15/2023   GFRNONAA >60 08/19/2024   Lab Results  Component Value Date   CHOL 191 11/15/2023   HDL 98 11/15/2023   LDLCALC 72 11/15/2023   TRIG 124 11/15/2023   CHOLHDL 1.9 11/15/2023   Lab Results  Component Value Date   TSH 3.557 08/18/2024   Lab Results  Component Value Date   HGBA1C 6.9 (H) 11/15/2023   Lab Results  Component Value Date   WBC 7.2 08/19/2024   HGB 13.2 08/19/2024   HCT 38.4 08/19/2024   MCV 89.9 08/19/2024   PLT 218 08/19/2024   Lab Results  Component Value Date   ALT 20 08/19/2024   AST 23 08/19/2024   ALKPHOS 73 08/19/2024   BILITOT 0.8 08/19/2024  Lab Results  Component Value Date   VD25OH 29.0 (L) 11/06/2021     Patient Active Problem List   Diagnosis Date Noted   Pancreatic lesion 08/22/2024   Pelvic mass in female 08/22/2024   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 Renita   BREAST BIOPSY Right 2006   bx/clip-neg   BREAST BIOPSY Right 08/19/2022   Right Breast stereo BX, coil clip, DENSE STROMAL FIBROSIS - NEGATIVE FOR ATYPIA   CATARACT EXTRACTION W/PHACO Right 03/16/2018   Procedure: CATARACT EXTRACTION PHACO AND INTRAOCULAR LENS PLACEMENT (IOC) RIGHT;  Surgeon: Mittie Gaskin, MD;  Location: [facility];  Service: Ophthalmology;  Laterality: Right;  requests early   CATARACT EXTRACTION W/PHACO Left 05/19/2023   Procedure: CATARACT EXTRACTION PHACO AND INTRAOCULAR LENS PLACEMENT (IOC) LEFT 10.12 00:55.9;  Surgeon: Mittie Gaskin, MD;  Location: [facility];  Service: Ophthalmology;  Laterality: Left;   Cologuard home test  12/2015   Negative   RETINAL DETACHMENT SURGERY Right 2018    Social History[2]   Medication list has been reviewed and updated.  Active Medications[3]     11/20/2024    8:28 AM 08/22/2024    9:05 AM 11/15/2023    7:58 AM 11/10/2022    8:00 AM  GAD 7 : Generalized Anxiety Score  Nervous, Anxious, on Edge 0 0 0 1  Control/stop worrying 0 0 1 0  Worry too much - different things 0 0 0 1  Trouble relaxing 0 0 0 1  Restless 0 0 0 1  Easily annoyed or irritable 1 0 0 1  Afraid - awful might happen 0 0 0 0  Total GAD 7 Score 1 0 1 5  Anxiety Difficulty Not difficult at all Not difficult at all Not difficult at all Not difficult at all       11/20/2024    8:28 AM 10/25/2024    3:33 PM 08/22/2024    9:05 AM  Depression screen PHQ 2/9  Decreased Interest 1 0 0  Down, Depressed, Hopeless 0 1 0  PHQ - 2 Score 1 1 0  Altered sleeping 1 0 0  Tired, decreased energy 1 1 0  Change in appetite 1 0 0   Feeling bad or failure about yourself  0 0 0  Trouble concentrating 0 0 0  Moving slowly or fidgety/restless 0 0 0  Suicidal thoughts 0 0 0  PHQ-9 Score 4 2 0   Difficult doing work/chores Not difficult at all Not difficult at all Not difficult at all     Data saved with a previous flowsheet row definition    BP Readings from Last 3 Encounters:  11/20/24 122/74  08/22/24 120/72  08/19/24 (!) 145/71    Physical Exam Vitals and nursing note reviewed.  Constitutional:      General: She is not in acute distress.    Appearance: She is well-developed.  HENT:     Head: Normocephalic and atraumatic.     Right Ear: Tympanic membrane and ear canal normal.     Left Ear: Tympanic  membrane and ear canal normal.     Nose:     Right Sinus: No maxillary sinus tenderness.     Left Sinus: No maxillary sinus tenderness.  Eyes:     General: No scleral icterus.       Right eye: No discharge.        Left eye: No discharge.     Conjunctiva/sclera: Conjunctivae normal.  Neck:     Thyroid : No thyromegaly.     Vascular: No carotid bruit.  Cardiovascular:     Rate and Rhythm: Normal rate and regular rhythm.     Pulses: Normal pulses.     Heart sounds: Normal heart sounds.  Pulmonary:     Effort: Pulmonary effort is normal. No respiratory distress.     Breath sounds: No wheezing.  Abdominal:     General: Bowel sounds are normal.     Palpations: Abdomen is soft.     Tenderness: There is no abdominal tenderness.  Musculoskeletal:     Cervical back: Normal range of motion. No erythema.     Right lower leg: No edema.     Left lower leg: No edema.  Lymphadenopathy:     Cervical: No cervical adenopathy.  Skin:    General: Skin is warm and dry.     Capillary Refill: Capillary refill takes less than 2 seconds.     Findings: No rash.  Neurological:     General: No focal deficit present.     Mental Status: She is alert and oriented to person, place, and time.     Cranial Nerves: No cranial  nerve deficit.     Sensory: No sensory deficit.     Deep Tendon Reflexes: Reflexes are normal and symmetric.  Psychiatric:        Attention and Perception: Attention normal.        Mood and Affect: Mood normal.        Behavior: Behavior normal.     Wt Readings from Last 3 Encounters:  11/20/24 152 lb (68.9 kg)  08/22/24 146 lb (66.2 kg)  08/18/24 148 lb (67.1 kg)    BP 122/74   Pulse 99   Ht 5' 7 (1.702 m)   Wt 152 lb (68.9 kg)   SpO2 97%   BMI 23.81 kg/m   Assessment and Plan:  Problem List Items Addressed This Visit       Unprioritized   Mixed hyperlipidemia (Chronic)   On atorvastatin  with good response and no side effects. Lab Results  Component Value Date   LDLCALC 72 11/15/2023         Relevant Orders   Lipid panel   Acquired hypothyroidism (Chronic)   Supplemented. Lab Results  Component Value Date   TSH 3.557 08/18/2024         Relevant Orders   TSH + free T4   Prediabetes (Chronic)   Relevant Orders   Comprehensive metabolic panel with GFR   Hemoglobin A1c   Other Visit Diagnoses       Annual physical exam    -  Primary     Encounter for screening mammogram for breast cancer       she will schedule this in March     Adnexal mass       she will return in February for new PCP and have MR ordered no recurrent symptoms noted with normal exam will check Ca-125   Relevant Orders   CBC with Differential/Platelet   CA 125  No follow-ups on file.    Leita HILARIO Adie, MD [facility] [place]           [1]  Allergies Allergen Reactions   Fosamax  [Alendronate ] Other (See Comments)    Body aches  [2]  Social History Tobacco Use   Smoking status: Never   Smokeless tobacco: Never  Vaping Use   Vaping status: Never Used  Substance Use Topics   Alcohol use: No   Drug use: No  [3]  Current Meds  Medication Sig   Ascorbic Acid (VITAMIN C) 100 MG tablet Take 1 tablet by mouth daily.    atorvastatin  (LIPITOR) 40 MG tablet Take 1 tablet (40 mg total) by mouth daily.   Calcium  Carbonate (CALCIUM  600 PO) Take 1 tablet by mouth daily.   ferrous sulfate 325 (65 FE) MG EC tablet Take 325 mg by mouth 3 (three) times daily with meals.   levothyroxine  (SYNTHROID ) 100 MCG tablet Take 1 tablet (100 mcg total) by mouth daily before breakfast.   LYSINE PO Take by mouth as needed.   MULTIPLE VITAMIN PO Take 1 tablet by mouth daily.   Vitamin D , Cholecalciferol, 1000 UNITS TABS Take 1 tablet by mouth daily.   Vitamin E 100 UNITS TABS Take 1 capsule by mouth daily.   "

## 2024-11-21 ENCOUNTER — Telehealth: Payer: Self-pay

## 2024-11-21 ENCOUNTER — Ambulatory Visit: Payer: Self-pay | Admitting: Internal Medicine

## 2024-11-21 DIAGNOSIS — E118 Type 2 diabetes mellitus with unspecified complications: Secondary | ICD-10-CM

## 2024-11-21 LAB — CBC WITH DIFFERENTIAL/PLATELET
Basophils Absolute: 0 x10E3/uL (ref 0.0–0.2)
Basos: 1 %
EOS (ABSOLUTE): 0.1 x10E3/uL (ref 0.0–0.4)
Eos: 2 %
Hematocrit: 43 % (ref 34.0–46.6)
Hemoglobin: 14.1 g/dL (ref 11.1–15.9)
Immature Grans (Abs): 0 x10E3/uL (ref 0.0–0.1)
Immature Granulocytes: 0 %
Lymphocytes Absolute: 1.2 x10E3/uL (ref 0.7–3.1)
Lymphs: 25 %
MCH: 30.4 pg (ref 26.6–33.0)
MCHC: 32.8 g/dL (ref 31.5–35.7)
MCV: 93 fL (ref 79–97)
Monocytes Absolute: 0.4 x10E3/uL (ref 0.1–0.9)
Monocytes: 8 %
Neutrophils Absolute: 3 x10E3/uL (ref 1.4–7.0)
Neutrophils: 64 %
Platelets: 246 x10E3/uL (ref 150–450)
RBC: 4.64 x10E6/uL (ref 3.77–5.28)
RDW: 12.5 % (ref 11.7–15.4)
WBC: 4.7 x10E3/uL (ref 3.4–10.8)

## 2024-11-21 LAB — COMPREHENSIVE METABOLIC PANEL WITH GFR
ALT: 30 IU/L (ref 0–32)
AST: 28 IU/L (ref 0–40)
Albumin: 4.5 g/dL (ref 3.7–4.7)
Alkaline Phosphatase: 113 IU/L (ref 48–129)
BUN/Creatinine Ratio: 26 (ref 12–28)
BUN: 19 mg/dL (ref 8–27)
Bilirubin Total: 0.5 mg/dL (ref 0.0–1.2)
CO2: 22 mmol/L (ref 20–29)
Calcium: 9.8 mg/dL (ref 8.7–10.3)
Chloride: 104 mmol/L (ref 96–106)
Creatinine, Ser: 0.73 mg/dL (ref 0.57–1.00)
Globulin, Total: 2.2 g/dL (ref 1.5–4.5)
Glucose: 140 mg/dL — ABNORMAL HIGH (ref 70–99)
Potassium: 5 mmol/L (ref 3.5–5.2)
Sodium: 141 mmol/L (ref 134–144)
Total Protein: 6.7 g/dL (ref 6.0–8.5)
eGFR: 82 mL/min/1.73

## 2024-11-21 LAB — LIPID PANEL
Chol/HDL Ratio: 2.7 ratio (ref 0.0–4.4)
Cholesterol, Total: 223 mg/dL — ABNORMAL HIGH (ref 100–199)
HDL: 82 mg/dL
LDL Chol Calc (NIH): 102 mg/dL — ABNORMAL HIGH (ref 0–99)
Triglycerides: 234 mg/dL — ABNORMAL HIGH (ref 0–149)
VLDL Cholesterol Cal: 39 mg/dL (ref 5–40)

## 2024-11-21 LAB — CA 125: Cancer Antigen (CA) 125: 30.7 U/mL (ref 0.0–38.1)

## 2024-11-21 LAB — TSH+FREE T4
Free T4: 1.19 ng/dL (ref 0.82–1.77)
TSH: 2.05 u[IU]/mL (ref 0.450–4.500)

## 2024-11-21 LAB — HEMOGLOBIN A1C
Est. average glucose Bld gHb Est-mCnc: 148 mg/dL
Hgb A1c MFr Bld: 6.8 % — ABNORMAL HIGH (ref 4.8–5.6)

## 2024-11-21 NOTE — Telephone Encounter (Signed)
 Called pt went over labs. She verbalized understanding.  KP

## 2024-11-21 NOTE — Telephone Encounter (Signed)
 Copied from CRM #[id number]. Topic: Clinical - Lab/Test Results >> Nov 21, 2024  1:01 PM Amy B wrote: Reason for CRM: Patient requests a call back to discuss lab results.  She is unable to access her MyChart.  Please call [phone]

## 2025-01-16 ENCOUNTER — Ambulatory Visit: Admitting: Student

## 2025-11-22 ENCOUNTER — Ambulatory Visit
